# Patient Record
Sex: Female | Born: 1980 | Race: White | Hispanic: No | Marital: Married | Smoking: Never smoker
Health system: Southern US, Community
[De-identification: ages and names within clinical notes are randomized; demographics above are authoritative.]

## PROBLEM LIST (undated history)

## (undated) DIAGNOSIS — E785 Hyperlipidemia, unspecified: Secondary | ICD-10-CM

## (undated) DIAGNOSIS — T7840XA Allergy, unspecified, initial encounter: Secondary | ICD-10-CM

## (undated) DIAGNOSIS — I10 Essential (primary) hypertension: Secondary | ICD-10-CM

## (undated) HISTORY — DX: Essential (primary) hypertension: I10

## (undated) HISTORY — DX: Allergy, unspecified, initial encounter: T78.40XA

## (undated) HISTORY — DX: Hyperlipidemia, unspecified: E78.5

---

## 2014-10-09 LAB — LIPID PANEL
CHOLESTEROL: 291 mg/dL — AB (ref 0–200)
HDL: 61 mg/dL (ref 35–70)
LDL Cholesterol: 188 mg/dL
TRIGLYCERIDES: 210 mg/dL — AB (ref 40–160)

## 2014-10-09 LAB — TSH: TSH: 4.5 u[IU]/mL (ref <=5.90)

## 2014-10-09 LAB — HM PAP SMEAR: HM PAP: NEGATIVE

## 2014-10-09 LAB — BASIC METABOLIC PANEL
BUN: 13 mg/dL (ref 4–21)
CREATININE: 0.8 mg/dL (ref <=1.1)

## 2014-10-09 LAB — HEPATIC FUNCTION PANEL
ALT: 100 U/L — AB (ref 7–35)
AST: 73 U/L — AB (ref 13–35)

## 2014-10-09 LAB — CBC AND DIFFERENTIAL: HEMOGLOBIN: 14.4 g/dL (ref 12.0–16.0)

## 2015-07-29 ENCOUNTER — Other Ambulatory Visit: Payer: Self-pay | Admitting: Internal Medicine

## 2015-08-03 ENCOUNTER — Other Ambulatory Visit: Payer: Self-pay | Admitting: Internal Medicine

## 2015-09-30 ENCOUNTER — Other Ambulatory Visit: Payer: Self-pay | Admitting: Internal Medicine

## 2015-09-30 ENCOUNTER — Encounter: Payer: Self-pay | Admitting: Internal Medicine

## 2015-09-30 DIAGNOSIS — I10 Essential (primary) hypertension: Secondary | ICD-10-CM | POA: Insufficient documentation

## 2015-09-30 DIAGNOSIS — Z3009 Encounter for other general counseling and advice on contraception: Secondary | ICD-10-CM | POA: Insufficient documentation

## 2015-09-30 DIAGNOSIS — E782 Mixed hyperlipidemia: Secondary | ICD-10-CM | POA: Insufficient documentation

## 2015-09-30 DIAGNOSIS — R7989 Other specified abnormal findings of blood chemistry: Secondary | ICD-10-CM | POA: Insufficient documentation

## 2015-09-30 DIAGNOSIS — R945 Abnormal results of liver function studies: Principal | ICD-10-CM

## 2015-09-30 DIAGNOSIS — D693 Immune thrombocytopenic purpura: Secondary | ICD-10-CM | POA: Insufficient documentation

## 2016-07-23 ENCOUNTER — Other Ambulatory Visit: Payer: Self-pay | Admitting: Internal Medicine

## 2016-07-24 NOTE — Telephone Encounter (Signed)
Patient is scheduled for 9/20 @ 230 for follow up

## 2016-07-28 ENCOUNTER — Other Ambulatory Visit: Payer: Self-pay | Admitting: Internal Medicine

## 2016-07-30 ENCOUNTER — Ambulatory Visit (INDEPENDENT_AMBULATORY_CARE_PROVIDER_SITE_OTHER): Payer: 59 | Admitting: Internal Medicine

## 2016-07-30 ENCOUNTER — Encounter: Payer: Self-pay | Admitting: Internal Medicine

## 2016-07-30 VITALS — BP 154/72 | HR 98 | Resp 16 | Ht 67.5 in | Wt 213.0 lb

## 2016-07-30 DIAGNOSIS — I10 Essential (primary) hypertension: Secondary | ICD-10-CM | POA: Diagnosis not present

## 2016-07-30 DIAGNOSIS — R945 Abnormal results of liver function studies: Secondary | ICD-10-CM

## 2016-07-30 DIAGNOSIS — A770 Spotted fever due to Rickettsia rickettsii: Secondary | ICD-10-CM

## 2016-07-30 DIAGNOSIS — E782 Mixed hyperlipidemia: Secondary | ICD-10-CM | POA: Diagnosis not present

## 2016-07-30 DIAGNOSIS — R7989 Other specified abnormal findings of blood chemistry: Secondary | ICD-10-CM

## 2016-07-30 NOTE — Progress Notes (Signed)
Date:  07/30/2016   Name:  Darlene Munoz   DOB:  1981-09-08   MRN:  [id number]   Chief Complaint: Hypertension Hypertension  This is a chronic problem. The current episode started more than 1 year ago. The problem is unchanged. The problem is controlled (high in doctors office but normal at home). Associated symptoms include headaches. Pertinent negatives include no chest pain, palpitations or shortness of breath.   RMSF - tick bite about 2 months ago then developed headache, low grade fever, rash and transient muscle cramps with facial tingling.  She started on empiric Doxy and felt better after a few days.  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  No bleeding issues.   Review of Systems  Constitutional: Positive for fatigue. Negative for chills, diaphoresis and fever.  Eyes: Negative for visual disturbance.  Respiratory: Negative for cough, chest tightness and shortness of breath.   Cardiovascular: Negative for chest pain, palpitations and leg swelling.  Gastrointestinal: Negative for abdominal pain.  Musculoskeletal: Negative for arthralgias, back pain and gait problem.  Skin: Positive for rash (resolving).  Neurological: Positive for headaches. Negative for dizziness.  Hematological: Negative for adenopathy. Does not bruise/bleed easily.  Psychiatric/Behavioral: Negative for sleep disturbance. The patient is not nervous/anxious.     Patient Active Problem List   Diagnosis Date Noted  . Benign hypertension 09/30/2015  . 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 Family planning 09/30/2015  . Elevated LFTs 09/30/2015  . Hyperlipidemia, mixed 09/30/2015    Prior to Admission medications   Medication Sig Start Date End Date Taking? Authorizing Provider  doxycycline (VIBRAMYCIN) 100 MG capsule Take 100 mg by mouth 2 (two) times daily.   Yes Historical Provider, MD  hydrochlorothiazide (HYDRODIURIL) 25 MG tablet Take 1 tablet by mouth  daily 07/28/16  Yes Glean Hess, MD    JUNEL 1.5/30 1.5-30 MG-MCG tablet Take 1 tablet by mouth  daily 07/23/16  Yes Glean Hess, MD  lisinopril (PRINIVIL,ZESTRIL) 10 MG tablet Take 1 tablet by mouth  daily 07/28/16  Yes Glean Hess, MD  metoprolol succinate (TOPROL-XL) 25 MG 24 hr tablet Take 1 tablet by mouth  daily 07/28/16  Yes Glean Hess, MD    No Known Allergies  History reviewed. No pertinent surgical history.  Social History  Substance Use Topics  . Smoking status: Never Smoker  . Smokeless tobacco: Never Used  . Alcohol use No     Medication list has been reviewed and updated.   Physical Exam  Constitutional: She is oriented to person, place, and time. She appears well-developed. No distress.  HENT:  Head: Normocephalic and atraumatic.  Neck: Normal range of motion. Neck supple. Carotid bruit is not present. No thyromegaly present.  Cardiovascular: Regular rhythm, normal heart sounds and intact distal pulses.  Tachycardia present.   Pulmonary/Chest: Effort normal and breath sounds normal. No respiratory distress. She has no decreased breath sounds. She has no wheezes.  Musculoskeletal: Normal range of motion. She exhibits no edema or tenderness.  Lymphadenopathy:    She has no cervical adenopathy.  Neurological: She is alert and oriented to person, place, and time.  Skin: Skin is warm and dry. Rash noted.     Psychiatric: She has a normal mood and affect. Her behavior is normal. Thought content normal.  Nursing note and vitals reviewed.   BP (!) 148/88   Pulse (!) 125   Resp 16   Ht 5' 7.5" (1.715 m)   Wt 213  lb (96.6 kg)   LMP 07/16/2016   SpO2 99%   BMI 32.87 kg/m   Assessment and Plan: 1. Benign hypertension Well controlled at home with [facility] established - CBC with Differential/Platelet - TSH  2. Elevated LFTs Continue to monitor - Comprehensive metabolic panel  3. Hyperlipidemia, mixed Return for CPX and fasting labs 4-6 months  4. RMSF [facility] spotted  fever) Finish course of doxycycline  Halina Maidens, MD [facility]  07/30/2016

## 2016-07-31 ENCOUNTER — Encounter: Payer: Self-pay | Admitting: Internal Medicine

## 2016-07-31 LAB — TSH: TSH: 3.88 u[IU]/mL (ref 0.450–4.500)

## 2016-07-31 LAB — CBC WITH DIFFERENTIAL/PLATELET
Basophils Absolute: 0 10*3/uL (ref 0.0–0.2)
Basos: 0 %
EOS (ABSOLUTE): 0.1 10*3/uL (ref 0.0–0.4)
EOS: 1 %
HEMATOCRIT: 39.3 % (ref 34.0–46.6)
HEMOGLOBIN: 13.6 g/dL (ref 11.1–15.9)
Immature Grans (Abs): 0 10*3/uL (ref 0.0–0.1)
Immature Granulocytes: 0 %
LYMPHS ABS: 2.1 10*3/uL (ref 0.7–3.1)
Lymphs: 29 %
MCH: 30 pg (ref 26.6–33.0)
MCHC: 34.6 g/dL (ref 31.5–35.7)
MCV: 87 fL (ref 79–97)
MONOCYTES: 7 %
Monocytes Absolute: 0.5 10*3/uL (ref 0.1–0.9)
NEUTROS ABS: 4.5 10*3/uL (ref 1.4–7.0)
Neutrophils: 63 %
Platelets: 199 10*3/uL (ref 150–379)
RBC: 4.54 x10E6/uL (ref 3.77–5.28)
RDW: 12.3 % (ref 12.3–15.4)
WBC: 7.1 10*3/uL (ref 3.4–10.8)

## 2016-07-31 LAB — COMPREHENSIVE METABOLIC PANEL
ALBUMIN: 4.4 g/dL (ref 3.5–5.5)
ALK PHOS: 74 IU/L (ref 39–117)
ALT: 45 IU/L — ABNORMAL HIGH (ref 0–32)
AST: 39 IU/L (ref 0–40)
Albumin/Globulin Ratio: 1.8 (ref 1.2–2.2)
BILIRUBIN TOTAL: 0.3 mg/dL (ref 0.0–1.2)
BUN / CREAT RATIO: 15 (ref 9–23)
BUN: 14 mg/dL (ref 6–20)
CHLORIDE: 97 mmol/L (ref 96–106)
CO2: 24 mmol/L (ref 18–29)
Calcium: 9.8 mg/dL (ref 8.7–10.2)
Creatinine, Ser: 0.91 mg/dL (ref 0.57–1.00)
GFR calc Af Amer: 97 mL/min/{1.73_m2} (ref >59)
GFR calc non Af Amer: 84 mL/min/{1.73_m2} (ref >59)
GLOBULIN, TOTAL: 2.5 g/dL (ref 1.5–4.5)
Glucose: 115 mg/dL — ABNORMAL HIGH (ref 65–99)
Potassium: 3.6 mmol/L (ref 3.5–5.2)
Sodium: 140 mmol/L (ref 134–144)
Total Protein: 6.9 g/dL (ref 6.0–8.5)

## 2016-08-25 ENCOUNTER — Encounter: Payer: Self-pay | Admitting: Internal Medicine

## 2016-08-27 DIAGNOSIS — H5213 Myopia, bilateral: Secondary | ICD-10-CM | POA: Diagnosis not present

## 2016-08-28 ENCOUNTER — Other Ambulatory Visit: Payer: Self-pay | Admitting: Internal Medicine

## 2016-08-28 MED ORDER — NORETHINDRONE ACET-ETHINYL EST 1.5-30 MG-MCG PO TABS: 1.0000 | ORAL_TABLET | Freq: Every day | ORAL | 3 refills | Status: DC

## 2016-11-21 ENCOUNTER — Other Ambulatory Visit: Payer: Self-pay | Admitting: Family Medicine

## 2016-11-21 DIAGNOSIS — J069 Acute upper respiratory infection, unspecified: Secondary | ICD-10-CM

## 2016-11-21 LAB — VERITOR FLU A/B WAIVED
INFLUENZA B: NEGATIVE
Influenza A: NEGATIVE

## 2016-11-24 NOTE — Progress Notes (Signed)
This encounter was created in error - please disregard.

## 2016-12-16 ENCOUNTER — Encounter: Payer: Self-pay | Admitting: Internal Medicine

## 2016-12-16 ENCOUNTER — Other Ambulatory Visit: Payer: Self-pay | Admitting: Internal Medicine

## 2016-12-16 MED ORDER — HYDROCHLOROTHIAZIDE 25 MG PO TABS: 25.0000 mg | ORAL_TABLET | Freq: Every day | ORAL | 3 refills | Status: DC

## 2016-12-16 MED ORDER — METOPROLOL SUCCINATE ER 25 MG PO TB24: 25.0000 mg | ORAL_TABLET | Freq: Every day | ORAL | 3 refills | Status: DC

## 2016-12-16 MED ORDER — LISINOPRIL 10 MG PO TABS: 10.0000 mg | ORAL_TABLET | Freq: Every day | ORAL | 3 refills | Status: DC

## 2016-12-16 MED ORDER — NORETHINDRONE ACET-ETHINYL EST 1.5-30 MG-MCG PO TABS: 1.0000 | ORAL_TABLET | Freq: Every day | ORAL | 3 refills | Status: DC

## 2016-12-24 ENCOUNTER — Encounter: Payer: Self-pay | Admitting: Internal Medicine

## 2016-12-25 ENCOUNTER — Other Ambulatory Visit: Payer: Self-pay | Admitting: Internal Medicine

## 2016-12-25 MED ORDER — METOPROLOL SUCCINATE ER 25 MG PO TB24: 25.0000 mg | ORAL_TABLET | Freq: Every day | ORAL | 0 refills | Status: DC

## 2016-12-25 MED ORDER — LISINOPRIL 10 MG PO TABS: 10.0000 mg | ORAL_TABLET | Freq: Every day | ORAL | 0 refills | Status: DC

## 2017-01-27 ENCOUNTER — Ambulatory Visit (INDEPENDENT_AMBULATORY_CARE_PROVIDER_SITE_OTHER): Payer: 59 | Admitting: Internal Medicine

## 2017-01-27 ENCOUNTER — Telehealth: Payer: Self-pay

## 2017-01-27 ENCOUNTER — Encounter: Payer: Self-pay | Admitting: Internal Medicine

## 2017-01-27 VITALS — BP 138/78 | HR 116 | Ht 67.5 in | Wt 213.0 lb

## 2017-01-27 DIAGNOSIS — Z0000 Encounter for general adult medical examination without abnormal findings: Secondary | ICD-10-CM | POA: Diagnosis not present

## 2017-01-27 DIAGNOSIS — D693 Immune thrombocytopenic purpura: Secondary | ICD-10-CM | POA: Diagnosis not present

## 2017-01-27 DIAGNOSIS — R7989 Other specified abnormal findings of blood chemistry: Secondary | ICD-10-CM | POA: Diagnosis not present

## 2017-01-27 DIAGNOSIS — I10 Essential (primary) hypertension: Secondary | ICD-10-CM | POA: Diagnosis not present

## 2017-01-27 DIAGNOSIS — R945 Abnormal results of liver function studies: Secondary | ICD-10-CM

## 2017-01-27 LAB — POCT URINALYSIS DIPSTICK
Bilirubin, UA: NEGATIVE
Glucose, UA: NEGATIVE
Ketones, UA: NEGATIVE
Leukocytes, UA: NEGATIVE
NITRITE UA: NEGATIVE
PH UA: 6 (ref 5.0–8.0)
Protein, UA: NEGATIVE
Spec Grav, UA: 1.01 (ref 1.030–1.035)
UROBILINOGEN UA: 0.2 (ref ?–2.0)

## 2017-01-27 MED ORDER — LISINOPRIL 20 MG PO TABS: 20.0000 mg | ORAL_TABLET | Freq: Every day | ORAL | 3 refills | Status: DC

## 2017-01-27 NOTE — Telephone Encounter (Signed)
ERROR

## 2017-01-27 NOTE — Progress Notes (Signed)
Date:  01/27/2017   Name:  Darlene Munoz   DOB:  1981/01/31   MRN:  [id number]   Chief Complaint: Annual Exam (no pap.) Darlene Munoz is a 36 y.o. female who presents today for her Complete Annual Exam. She feels well. She reports exercising regularly. She reports she is sleeping well except for night terrors that occur about twice a week. Menses are regular on OCPs.  No breast issues.  Pap is due next year.  Hypertension  This is a chronic problem. The current episode started more than 1 month ago. The problem is controlled. Pertinent negatives include no chest pain, headaches, palpitations or shortness of breath. Past treatments include ACE inhibitors, diuretics and beta blockers. The current treatment provides significant improvement.  BP are running in the 130's over 80's.  She would like it to be slightly lower.    Review of Systems  Constitutional: Negative for chills, fatigue and fever.  HENT: Negative for congestion, hearing loss, tinnitus, trouble swallowing and voice change.   Eyes: Negative for visual disturbance.  Respiratory: Negative for cough, chest tightness, shortness of breath and wheezing.   Cardiovascular: Negative for chest pain, palpitations and leg swelling.  Gastrointestinal: Negative for abdominal pain, constipation and diarrhea.  Endocrine: Negative for polydipsia and polyuria.  Genitourinary: Negative for dysuria, frequency, genital sores, vaginal bleeding and vaginal discharge.  Musculoskeletal: Negative for arthralgias, gait problem and joint swelling.  Skin: Negative for color change and rash.  Neurological: Negative for dizziness, tremors, light-headedness and headaches.  Hematological: Negative for adenopathy. Does not bruise/bleed easily.  Psychiatric/Behavioral: Positive for sleep disturbance. Negative for dysphoric mood. The patient is not nervous/anxious.     Patient Active Problem List   Diagnosis Date Noted  . Benign hypertension 09/30/2015   . Immune thrombocytopenia ([place]) 09/30/2015  . Family planning 09/30/2015  . Elevated LFTs 09/30/2015  . Hyperlipidemia, mixed 09/30/2015    Prior to Admission medications   Medication Sig Start Date End Date Taking? Authorizing Provider  hydrochlorothiazide (HYDRODIURIL) 25 MG tablet Take 1 tablet (25 mg total) by mouth daily. 12/16/16  Yes Glean Hess, MD  lisinopril (PRINIVIL,ZESTRIL) 10 MG tablet Take 1 tablet (10 mg total) by mouth daily. 12/25/16  Yes Glean Hess, MD  metoprolol succinate (TOPROL-XL) 25 MG 24 hr tablet Take 1 tablet (25 mg total) by mouth daily. 12/25/16  Yes Glean Hess, MD  Norethindrone Acetate-Ethinyl Estradiol (JUNEL 1.5/30) 1.5-30 MG-MCG tablet Take 1 tablet by mouth daily. 12/16/16  Yes Glean Hess, MD    No Known Allergies  History reviewed. No pertinent surgical history.  Social History  Substance Use Topics  . Smoking status: Never Smoker  . Smokeless tobacco: Never Used  . Alcohol use No     Medication list has been reviewed and updated.   Physical Exam  Constitutional: She is oriented to person, place, and time. She appears well-developed and well-nourished. No distress.  HENT:  Head: Normocephalic and atraumatic.  Right Ear: Tympanic membrane and ear canal normal.  Left Ear: Tympanic membrane and ear canal normal.  Nose: Right sinus exhibits no maxillary sinus tenderness. Left sinus exhibits no maxillary sinus tenderness.  Mouth/Throat: Uvula is midline and oropharynx is clear and moist.  Eyes: Conjunctivae and EOM are normal. Right eye exhibits no discharge. Left eye exhibits no discharge. No scleral icterus.  Neck: Normal range of motion. Carotid bruit is not present. No erythema present. No thyromegaly present.  Cardiovascular: Normal rate, regular rhythm,  normal heart sounds and normal pulses.   Pulmonary/Chest: Effort normal. No respiratory distress. She has no wheezes. Right breast exhibits no mass, no nipple discharge,  no skin change and no tenderness. Left breast exhibits no mass, no nipple discharge, no skin change and no tenderness.  Abdominal: Soft. Bowel sounds are normal. There is no hepatosplenomegaly. There is no tenderness. There is no CVA tenderness.  Musculoskeletal: Normal range of motion.  Lymphadenopathy:    She has no cervical adenopathy.    She has no axillary adenopathy.  Neurological: She is alert and oriented to person, place, and time. She has normal reflexes. No cranial nerve deficit or sensory deficit.  Skin: Skin is warm, dry and intact. No rash noted.  Psychiatric: She has a normal mood and affect. Her speech is normal and behavior is normal. Thought content normal.  Nursing note and vitals reviewed.   BP 138/78 (BP Location: Right Arm, Patient Position: Sitting, Cuff Size: Large)   Pulse (!) 116   Ht 5' 7.5" (1.715 m)   Wt 213 lb (96.6 kg)   LMP 01/27/2017 (Exact Date)   SpO2 100%   BMI 32.87 kg/m   Assessment and Plan: 1. Annual physical exam Normal exam Pap next year - Lipid panel - POCT urinalysis dipstick - Hemoglobin A1c  2. Benign hypertension Increase dose of lisinopril to 20 mg - Comprehensive metabolic panel - TSH  3. Immune thrombocytopenia (HCC) - CBC with Differential/Platelet  4. Elevated LFTs Monitoring - continue low fat diet   Meds ordered this encounter  Medications  . lisinopril (PRINIVIL,ZESTRIL) 20 MG tablet    Sig: Take 1 tablet (20 mg total) by mouth daily.    Dispense:  90 tablet    Refill:  [place], MD [facility]  01/27/2017

## 2017-01-27 NOTE — Patient Instructions (Signed)
DASH Eating Plan DASH stands for "Dietary Approaches to Stop Hypertension." The DASH eating plan is a healthy eating plan that has been shown to reduce high blood pressure (hypertension). It may also reduce your risk for type 2 diabetes, heart disease, and stroke. The DASH eating plan may also help with weight loss. What are tips for following this plan? General guidelines  Avoid eating more than 2,300 mg (milligrams) of salt (sodium) a day. If you have hypertension, you may need to reduce your sodium intake to 1,500 mg a day.  Limit alcohol intake to no more than 1 drink a day for nonpregnant women and 2 drinks a day for men. One drink equals 12 oz of beer, 5 oz of wine, or 1 oz of hard liquor.  Work with your health care provider to maintain a healthy body weight or to lose weight. Ask what an ideal weight is for you.  Get at least 30 minutes of exercise that causes your heart to beat faster (aerobic exercise) most days of the week. Activities may include walking, swimming, or biking.  Work with your health care provider or diet and nutrition specialist (dietitian) to adjust your eating plan to your individual calorie needs. Reading food labels  Check food labels for the amount of sodium per serving. Choose foods with less than 5 percent of the Daily Value of sodium. Generally, foods with less than 300 mg of sodium per serving fit into this eating plan.  To find whole grains, look for the word "whole" as the first word in the ingredient list. Shopping  Buy products labeled as "low-sodium" or "no salt added."  Buy fresh foods. Avoid canned foods and premade or frozen meals. Cooking  Avoid adding salt when cooking. Use salt-free seasonings or herbs instead of table salt or sea salt. Check with your health care provider or pharmacist before using salt substitutes.  Do not fry foods. Cook foods using healthy methods such as baking, boiling, grilling, and broiling instead.  Cook with  heart-healthy oils, such as olive, canola, soybean, or sunflower oil. Meal planning   Eat a balanced diet that includes: ? 5 or more servings of fruits and vegetables each day. At each meal, try to fill half of your plate with fruits and vegetables. ? Up to 6-8 servings of whole grains each day. ? Less than 6 oz of lean meat, poultry, or fish each day. A 3-oz serving of meat is about the same size as a deck of cards. One egg equals 1 oz. ? 2 servings of low-fat dairy each day. ? A serving of nuts, seeds, or beans 5 times each week. ? Heart-healthy fats. Healthy fats called Omega-3 fatty acids are found in foods such as flaxseeds and coldwater fish, like sardines, salmon, and mackerel.  Limit how much you eat of the following: ? Canned or prepackaged foods. ? Food that is high in trans fat, such as fried foods. ? Food that is high in saturated fat, such as fatty meat. ? Sweets, desserts, sugary drinks, and other foods with added sugar. ? Full-fat dairy products.  Do not salt foods before eating.  Try to eat at least 2 vegetarian meals each week.  Eat more home-cooked food and less restaurant, buffet, and fast food.  When eating at a restaurant, ask that your food be prepared with less salt or no salt, if possible. What foods are recommended? The items listed may not be a complete list. Talk with your dietitian about what   dietary choices are best for you. Grains Whole-grain or whole-wheat bread. Whole-grain or whole-wheat pasta. Brown rice. Oatmeal. Quinoa. Bulgur. Whole-grain and low-sodium cereals. Pita bread. Low-fat, low-sodium crackers. Whole-wheat flour tortillas. Vegetables Fresh or frozen vegetables (raw, steamed, roasted, or grilled). Low-sodium or reduced-sodium tomato and vegetable juice. Low-sodium or reduced-sodium tomato sauce and tomato paste. Low-sodium or reduced-sodium canned vegetables. Fruits All fresh, dried, or frozen fruit. Canned fruit in natural juice (without  added sugar). Meat and other protein foods Skinless chicken or turkey. Ground chicken or turkey. Pork with fat trimmed off. Fish and seafood. Egg whites. Dried beans, peas, or lentils. Unsalted nuts, nut butters, and seeds. Unsalted canned beans. Lean cuts of beef with fat trimmed off. Low-sodium, lean deli meat. Dairy Low-fat (1%) or fat-free (skim) milk. Fat-free, low-fat, or reduced-fat cheeses. Nonfat, low-sodium ricotta or cottage cheese. Low-fat or nonfat yogurt. Low-fat, low-sodium cheese. Fats and oils Soft margarine without trans fats. Vegetable oil. Low-fat, reduced-fat, or light mayonnaise and salad dressings (reduced-sodium). Canola, safflower, olive, soybean, and sunflower oils. Avocado. Seasoning and other foods Herbs. Spices. Seasoning mixes without salt. Unsalted popcorn and pretzels. Fat-free sweets. What foods are not recommended? The items listed may not be a complete list. Talk with your dietitian about what dietary choices are best for you. Grains Baked goods made with fat, such as croissants, muffins, or some breads. Dry pasta or rice meal packs. Vegetables Creamed or fried vegetables. Vegetables in a cheese sauce. Regular canned vegetables (not low-sodium or reduced-sodium). Regular canned tomato sauce and paste (not low-sodium or reduced-sodium). Regular tomato and vegetable juice (not low-sodium or reduced-sodium). Pickles. Olives. Fruits Canned fruit in a light or heavy syrup. Fried fruit. Fruit in cream or butter sauce. Meat and other protein foods Fatty cuts of meat. Ribs. Fried meat. Bacon. Sausage. Bologna and other processed lunch meats. Salami. Fatback. Hotdogs. Bratwurst. Salted nuts and seeds. Canned beans with added salt. Canned or smoked fish. Whole eggs or egg yolks. Chicken or turkey with skin. Dairy Whole or 2% milk, cream, and half-and-half. Whole or full-fat cream cheese. Whole-fat or sweetened yogurt. Full-fat cheese. Nondairy creamers. Whipped toppings.  Processed cheese and cheese spreads. Fats and oils Butter. Stick margarine. Lard. Shortening. Ghee. Bacon fat. Tropical oils, such as coconut, palm kernel, or palm oil. Seasoning and other foods Salted popcorn and pretzels. Onion salt, garlic salt, seasoned salt, table salt, and sea salt. Worcestershire sauce. Tartar sauce. Barbecue sauce. Teriyaki sauce. Soy sauce, including reduced-sodium. Steak sauce. Canned and packaged gravies. Fish sauce. Oyster sauce. Cocktail sauce. Horseradish that you find on the shelf. Ketchup. Mustard. Meat flavorings and tenderizers. Bouillon cubes. Hot sauce and Tabasco sauce. Premade or packaged marinades. Premade or packaged taco seasonings. Relishes. Regular salad dressings. Where to find more information:  National Heart, Lung, and Blood Institute: www.nhlbi.nih.gov  American Heart Association: www.heart.org Summary  The DASH eating plan is a healthy eating plan that has been shown to reduce high blood pressure (hypertension). It may also reduce your risk for type 2 diabetes, heart disease, and stroke.  With the DASH eating plan, you should limit salt (sodium) intake to 2,300 mg a day. If you have hypertension, you may need to reduce your sodium intake to 1,500 mg a day.  When on the DASH eating plan, aim to eat more fresh fruits and vegetables, whole grains, lean proteins, low-fat dairy, and heart-healthy fats.  Work with your health care provider or diet and nutrition specialist (dietitian) to adjust your eating plan to your individual   calorie needs. This information is not intended to replace advice given to you by your health care provider. Make sure you discuss any questions you have with your health care provider. Document Released: 10/16/2011 Document Revised: 10/20/2016 Document Reviewed: 10/20/2016 Elsevier Interactive Patient Education  2017 Elsevier Inc.  

## 2017-01-28 LAB — CBC WITH DIFFERENTIAL/PLATELET
BASOS ABS: 0 10*3/uL (ref 0.0–0.2)
Basos: 1 %
EOS (ABSOLUTE): 0.1 10*3/uL (ref 0.0–0.4)
Eos: 1 %
HEMOGLOBIN: 14.4 g/dL (ref 11.1–15.9)
Hematocrit: 43.1 % (ref 34.0–46.6)
Immature Grans (Abs): 0 10*3/uL (ref 0.0–0.1)
Immature Granulocytes: 0 %
Lymphocytes Absolute: 1.9 10*3/uL (ref 0.7–3.1)
Lymphs: 27 %
MCH: 30.2 pg (ref 26.6–33.0)
MCHC: 33.4 g/dL (ref 31.5–35.7)
MCV: 90 fL (ref 79–97)
MONOCYTES: 4 %
Monocytes Absolute: 0.3 10*3/uL (ref 0.1–0.9)
NEUTROS ABS: 4.5 10*3/uL (ref 1.4–7.0)
Neutrophils: 67 %
PLATELETS: 178 10*3/uL (ref 150–379)
RBC: 4.77 x10E6/uL (ref 3.77–5.28)
RDW: 13 % (ref 12.3–15.4)
WBC: 6.8 10*3/uL (ref 3.4–10.8)

## 2017-01-28 LAB — LIPID PANEL
CHOL/HDL RATIO: 4.8 ratio — AB (ref 0.0–4.4)
Cholesterol, Total: 246 mg/dL — ABNORMAL HIGH (ref 100–199)
HDL: 51 mg/dL (ref >39)
LDL CALC: 155 mg/dL — AB (ref 0–99)
TRIGLYCERIDES: 201 mg/dL — AB (ref 0–149)
VLDL Cholesterol Cal: 40 mg/dL (ref 5–40)

## 2017-01-28 LAB — COMPREHENSIVE METABOLIC PANEL
ALBUMIN: 4.7 g/dL (ref 3.5–5.5)
ALT: 65 IU/L — AB (ref 0–32)
AST: 41 IU/L — ABNORMAL HIGH (ref 0–40)
Albumin/Globulin Ratio: 1.9 (ref 1.2–2.2)
Alkaline Phosphatase: 83 IU/L (ref 39–117)
BUN / CREAT RATIO: 20 (ref 9–23)
BUN: 17 mg/dL (ref 6–20)
Bilirubin Total: 0.4 mg/dL (ref 0.0–1.2)
CALCIUM: 9.7 mg/dL (ref 8.7–10.2)
CO2: 24 mmol/L (ref 18–29)
CREATININE: 0.85 mg/dL (ref 0.57–1.00)
Chloride: 98 mmol/L (ref 96–106)
GFR calc non Af Amer: 90 mL/min/{1.73_m2} (ref >59)
GFR, EST AFRICAN AMERICAN: 104 mL/min/{1.73_m2} (ref >59)
GLUCOSE: 72 mg/dL (ref 65–99)
Globulin, Total: 2.5 g/dL (ref 1.5–4.5)
Potassium: 4.2 mmol/L (ref 3.5–5.2)
Sodium: 142 mmol/L (ref 134–144)
TOTAL PROTEIN: 7.2 g/dL (ref 6.0–8.5)

## 2017-01-28 LAB — TSH: TSH: 5.11 u[IU]/mL — ABNORMAL HIGH (ref 0.450–4.500)

## 2017-01-28 LAB — HEMOGLOBIN A1C
ESTIMATED AVERAGE GLUCOSE: 100 mg/dL
Hgb A1c MFr Bld: 5.1 % (ref 4.8–5.6)

## 2017-03-03 ENCOUNTER — Encounter: Payer: Self-pay | Admitting: Internal Medicine

## 2017-07-29 ENCOUNTER — Ambulatory Visit (INDEPENDENT_AMBULATORY_CARE_PROVIDER_SITE_OTHER): Payer: 59 | Admitting: Internal Medicine

## 2017-07-29 ENCOUNTER — Encounter: Payer: Self-pay | Admitting: Internal Medicine

## 2017-07-29 VITALS — BP 122/78 | HR 110 | Ht 67.5 in | Wt 212.0 lb

## 2017-07-29 DIAGNOSIS — I10 Essential (primary) hypertension: Secondary | ICD-10-CM | POA: Diagnosis not present

## 2017-07-29 DIAGNOSIS — R946 Abnormal results of thyroid function studies: Secondary | ICD-10-CM | POA: Diagnosis not present

## 2017-07-29 DIAGNOSIS — E782 Mixed hyperlipidemia: Secondary | ICD-10-CM | POA: Diagnosis not present

## 2017-07-29 DIAGNOSIS — G4750 Parasomnia, unspecified: Secondary | ICD-10-CM | POA: Diagnosis not present

## 2017-07-29 DIAGNOSIS — R7989 Other specified abnormal findings of blood chemistry: Secondary | ICD-10-CM

## 2017-07-29 DIAGNOSIS — D693 Immune thrombocytopenic purpura: Secondary | ICD-10-CM

## 2017-07-29 NOTE — Progress Notes (Signed)
Date:  07/29/2017   Name:  Darlene Munoz   DOB:  [birth year]   MRN:  [id number]   Chief Complaint: Hypertension and Hypothyroidism Hypertension  Pertinent negatives include no chest pain, headaches, palpitations or shortness of breath. Identifiable causes of hypertension include a thyroid problem.  Thyroid Problem  Presents for follow-up visit. Patient reports no fatigue, menstrual problem, palpitations or tremors. Her past medical history is significant for hyperlipidemia.  Hyperlipidemia  This is a chronic problem. Recent lipid tests were reviewed and are high. There are no known factors aggravating her hyperlipidemia. Pertinent negatives include no chest pain or shortness of breath. Current antihyperlipidemic treatment includes diet change.  Last visit TSH was slightly elevated. She decided to hold off on supplements and have it rechecked at today's visit. Overall she is doing well perhaps slightly more fatigued than usual. No change in weight or appetite or mood other than mild irritability at times. Parasomnias - childhood she's had parasomnias that mostly involve night terrors or grabbing or wild thrashing movements in bed. Rarely does she have sleepwalking. According to her husband, she exhibits these behaviors once per night 4-5 times per week. Thrombocytopenia - this has been chronic and mild. No bleeding difficulties noted per patient  Lab Results  Component Value Date   TSH 5.110 (H) 01/27/2017     Review of Systems  Constitutional: Negative for chills, fatigue, fever and unexpected weight change.  Eyes: Negative for visual disturbance.  Respiratory: Negative for chest tightness and shortness of breath.   Cardiovascular: Negative for chest pain, palpitations and leg swelling.  Gastrointestinal: Negative for abdominal pain.  Genitourinary: Negative for menstrual problem.  Musculoskeletal: Negative for arthralgias.  Neurological: Negative for dizziness, tremors, weakness  and headaches.  Psychiatric/Behavioral: Positive for sleep disturbance (parasomnias with grabbing and jerking). Negative for dysphoric mood.    Patient Active Problem List   Diagnosis Date Noted  . Benign hypertension 09/30/2015  . Immune thrombocytopenia ([place]) 09/30/2015  . Family planning 09/30/2015  . Elevated LFTs 09/30/2015  . Hyperlipidemia, mixed 09/30/2015    Prior to Admission medications   Medication Sig Start Date End Date Taking? Authorizing Provider  hydrochlorothiazide (HYDRODIURIL) 25 MG tablet Take 1 tablet (25 mg total) by mouth daily. 12/16/16   Glean Hess, MD  lisinopril (PRINIVIL,ZESTRIL) 20 MG tablet Take 1 tablet (20 mg total) by mouth daily. 01/27/17   Glean Hess, MD  metoprolol succinate (TOPROL-XL) 25 MG 24 hr tablet Take 1 tablet (25 mg total) by mouth daily. 12/25/16   Glean Hess, MD  Norethindrone Acetate-Ethinyl Estradiol (JUNEL 1.5/30) 1.5-30 MG-MCG tablet Take 1 tablet by mouth daily. 12/16/16   Glean Hess, MD    No Known Allergies  History reviewed. No pertinent surgical history.  Social History  Substance Use Topics  . Smoking status: Never Smoker  . Smokeless tobacco: Never Used  . Alcohol use No     Medication list has been reviewed and updated.  PHQ 2/9 Scores 07/29/2017  PHQ - 2 Score 0    Physical Exam  Constitutional: She is oriented to person, place, and time. She appears well-developed. No distress.  HENT:  Head: Normocephalic and atraumatic.  Neck: Normal range of motion. Neck supple. Carotid bruit is not present. No thyromegaly present.  Cardiovascular: Normal rate, regular rhythm and normal heart sounds.   Pulmonary/Chest: Effort normal and breath sounds normal. No respiratory distress. She has no wheezes.  Musculoskeletal: Normal range of motion.  Neurological: She is  alert and oriented to person, place, and time.  Skin: Skin is warm, dry and intact. No rash noted.  Psychiatric: She has a normal mood  and affect. Her speech is normal and behavior is normal. Thought content normal.  Nursing note and vitals reviewed.   BP 124/78   Pulse (!) 110   Ht 5' 7.5" (1.715 m)   Wt 212 lb (96.2 kg)   LMP 07/14/2017 (Exact Date)   SpO2 98%   BMI 32.71 kg/m   Assessment and Plan: 1. Benign hypertension controlled - Comprehensive metabolic panel  2. Immune thrombocytopenia (HCC) Continue to monitor - CBC with Differential/Platelet  3. Abnormal TSH Repeat labs today - Thyroid antibodies - TSH  4. 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   Partially dictated using Editor, commissioning. Any errors are unintentional.  Halina Maidens, MD [facility]  07/29/2017

## 2017-07-30 LAB — COMPREHENSIVE METABOLIC PANEL
ALT: 75 IU/L — AB (ref 0–32)
AST: 57 IU/L — ABNORMAL HIGH (ref 0–40)
Albumin/Globulin Ratio: 1.7 (ref 1.2–2.2)
Albumin: 4.7 g/dL (ref 3.5–5.5)
Alkaline Phosphatase: 89 IU/L (ref 39–117)
BILIRUBIN TOTAL: 0.4 mg/dL (ref 0.0–1.2)
BUN/Creatinine Ratio: 18 (ref 9–23)
BUN: 14 mg/dL (ref 6–20)
CALCIUM: 9.5 mg/dL (ref 8.7–10.2)
CHLORIDE: 98 mmol/L (ref 96–106)
CO2: 24 mmol/L (ref 20–29)
CREATININE: 0.8 mg/dL (ref 0.57–1.00)
GFR calc non Af Amer: 97 mL/min/{1.73_m2} (ref >59)
GFR, EST AFRICAN AMERICAN: 112 mL/min/{1.73_m2} (ref >59)
Globulin, Total: 2.7 g/dL (ref 1.5–4.5)
Glucose: 75 mg/dL (ref 65–99)
Potassium: 3.7 mmol/L (ref 3.5–5.2)
Sodium: 140 mmol/L (ref 134–144)
TOTAL PROTEIN: 7.4 g/dL (ref 6.0–8.5)

## 2017-07-30 LAB — CBC WITH DIFFERENTIAL/PLATELET
BASOS ABS: 0 10*3/uL (ref 0.0–0.2)
Basos: 0 %
EOS (ABSOLUTE): 0.1 10*3/uL (ref 0.0–0.4)
Eos: 1 %
HEMOGLOBIN: 14.2 g/dL (ref 11.1–15.9)
Hematocrit: 41.1 % (ref 34.0–46.6)
IMMATURE GRANS (ABS): 0 10*3/uL (ref 0.0–0.1)
Immature Granulocytes: 0 %
LYMPHS: 28 %
Lymphocytes Absolute: 2.6 10*3/uL (ref 0.7–3.1)
MCH: 30.6 pg (ref 26.6–33.0)
MCHC: 34.5 g/dL (ref 31.5–35.7)
MCV: 89 fL (ref 79–97)
MONOCYTES: 6 %
Monocytes Absolute: 0.6 10*3/uL (ref 0.1–0.9)
NEUTROS ABS: 6 10*3/uL (ref 1.4–7.0)
Neutrophils: 65 %
Platelets: 186 10*3/uL (ref 150–379)
RBC: 4.64 x10E6/uL (ref 3.77–5.28)
RDW: 12.6 % (ref 12.3–15.4)
WBC: 9.3 10*3/uL (ref 3.4–10.8)

## 2017-07-30 LAB — LIPID PANEL
CHOLESTEROL TOTAL: 209 mg/dL — AB (ref 100–199)
Chol/HDL Ratio: 4.2 ratio (ref 0.0–4.4)
HDL: 50 mg/dL (ref >39)
LDL CALC: 125 mg/dL — AB (ref 0–99)
Triglycerides: 170 mg/dL — ABNORMAL HIGH (ref 0–149)
VLDL Cholesterol Cal: 34 mg/dL (ref 5–40)

## 2017-07-30 LAB — THYROID ANTIBODIES
Thyroglobulin Antibody: 2.4 IU/mL — ABNORMAL HIGH (ref 0.0–0.9)
Thyroperoxidase Ab SerPl-aCnc: 11 IU/mL (ref 0–34)

## 2017-07-30 LAB — TSH: TSH: 4.47 u[IU]/mL (ref 0.450–4.500)

## 2017-07-31 ENCOUNTER — Encounter: Payer: Self-pay | Admitting: Internal Medicine

## 2017-08-20 ENCOUNTER — Encounter: Payer: Self-pay | Admitting: Internal Medicine

## 2017-09-02 DIAGNOSIS — H5213 Myopia, bilateral: Secondary | ICD-10-CM | POA: Diagnosis not present

## 2017-10-24 ENCOUNTER — Encounter: Payer: Self-pay | Admitting: Internal Medicine

## 2017-10-24 ENCOUNTER — Other Ambulatory Visit: Payer: Self-pay | Admitting: Internal Medicine

## 2017-10-26 ENCOUNTER — Encounter: Payer: Self-pay | Admitting: Internal Medicine

## 2017-10-26 ENCOUNTER — Other Ambulatory Visit: Payer: Self-pay

## 2017-10-26 MED ORDER — NORETHINDRONE ACET-ETHINYL EST 1.5-30 MG-MCG PO TABS: 1.0000 | ORAL_TABLET | Freq: Every day | ORAL | 3 refills | Status: DC

## 2017-10-26 MED ORDER — LISINOPRIL 20 MG PO TABS: 20.0000 mg | ORAL_TABLET | Freq: Every day | ORAL | 3 refills | Status: DC

## 2017-10-26 MED ORDER — HYDROCHLOROTHIAZIDE 25 MG PO TABS: 25.0000 mg | ORAL_TABLET | Freq: Every day | ORAL | 3 refills | Status: DC

## 2017-10-26 MED ORDER — METOPROLOL SUCCINATE ER 25 MG PO TB24: 25.0000 mg | ORAL_TABLET | Freq: Every day | ORAL | 0 refills | Status: DC

## 2017-10-26 NOTE — Progress Notes (Signed)
Patient called requested refills on medication. Sent all four meds to [place].

## 2017-11-12 ENCOUNTER — Other Ambulatory Visit: Payer: Self-pay

## 2017-11-12 ENCOUNTER — Encounter: Payer: Self-pay | Admitting: Internal Medicine

## 2017-11-12 MED ORDER — METOPROLOL SUCCINATE ER 25 MG PO TB24: 25.0000 mg | ORAL_TABLET | Freq: Every day | ORAL | 3 refills | Status: DC

## 2017-11-27 ENCOUNTER — Encounter: Payer: Self-pay | Admitting: Internal Medicine

## 2017-11-30 ENCOUNTER — Telehealth: Payer: Self-pay

## 2017-11-30 NOTE — Telephone Encounter (Signed)
Filled out patient form for [employer] about Hypertension. Attatched last labs, and last 3 office visits as requested from form along with medication list. Faxed the form and received approval fax confirmation from [place].

## 2018-02-18 ENCOUNTER — Ambulatory Visit (INDEPENDENT_AMBULATORY_CARE_PROVIDER_SITE_OTHER): Payer: 59 | Admitting: Internal Medicine

## 2018-02-18 ENCOUNTER — Encounter: Payer: Self-pay | Admitting: Internal Medicine

## 2018-02-18 VITALS — BP 138/78 | HR 92 | Ht 67.5 in | Wt 220.0 lb

## 2018-02-18 DIAGNOSIS — E782 Mixed hyperlipidemia: Secondary | ICD-10-CM

## 2018-02-18 DIAGNOSIS — Z124 Encounter for screening for malignant neoplasm of cervix: Secondary | ICD-10-CM | POA: Diagnosis not present

## 2018-02-18 DIAGNOSIS — Z0000 Encounter for general adult medical examination without abnormal findings: Secondary | ICD-10-CM

## 2018-02-18 DIAGNOSIS — Z23 Encounter for immunization: Secondary | ICD-10-CM | POA: Diagnosis not present

## 2018-02-18 DIAGNOSIS — D693 Immune thrombocytopenic purpura: Secondary | ICD-10-CM | POA: Diagnosis not present

## 2018-02-18 DIAGNOSIS — I10 Essential (primary) hypertension: Secondary | ICD-10-CM

## 2018-02-18 LAB — POCT URINALYSIS DIPSTICK
BILIRUBIN UA: NEGATIVE
Blood, UA: NEGATIVE
GLUCOSE UA: NEGATIVE
Ketones, UA: NEGATIVE
Leukocytes, UA: NEGATIVE
Nitrite, UA: NEGATIVE
Protein, UA: NEGATIVE
Spec Grav, UA: <=1.005 — AB (ref 1.010–1.025)
Urobilinogen, UA: 0.2 E.U./dL
pH, UA: 7 (ref 5.0–8.0)

## 2018-02-18 NOTE — Progress Notes (Signed)
Date:  02/18/2018   Name:  Darlene Munoz   DOB:  [birth year]   MRN:  [id number]   Chief Complaint: Annual Exam (Breast Exam. Pap smear. 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 Needs tdap injection.)  Darlene Munoz is a 37 y.o. female who presents today for her Complete Annual Exam. She feels well. She reports exercising some. She reports she is sleeping well.   Hypertension  This is a chronic problem. The problem is controlled. Pertinent negatives include no chest pain, headaches, palpitations or shortness of breath. Past treatments include beta blockers, diuretics and ACE inhibitors. There are no compliance problems.      Review of Systems  Constitutional: Negative for chills, fatigue and fever.  HENT: Negative for congestion, hearing loss, tinnitus, trouble swallowing and voice change.   Eyes: Negative for visual disturbance.  Respiratory: Negative for cough, chest tightness, shortness of breath and wheezing.   Cardiovascular: Negative for chest pain, palpitations and leg swelling.  Gastrointestinal: Negative for abdominal pain, constipation, diarrhea and vomiting.  Endocrine: Negative for polydipsia and polyuria.  Genitourinary: Negative for dysuria, frequency, genital sores, vaginal bleeding and vaginal discharge.  Musculoskeletal: Negative for arthralgias, gait problem and joint swelling.  Skin: Negative for color change and rash.  Neurological: Negative for dizziness, tremors, light-headedness and headaches.  Hematological: Negative for adenopathy. Does not bruise/bleed easily.  Psychiatric/Behavioral: Negative for dysphoric mood and sleep disturbance. The patient is not nervous/anxious.     Patient Active Problem List   Diagnosis Date Noted  . 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 Benign hypertension 09/30/2015  . 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 Family planning 09/30/2015  . Elevated LFTs 09/30/2015  . Hyperlipidemia, mixed 09/30/2015    Prior to Admission medications     Medication Sig Start Date End Date Taking? Authorizing Provider  cetirizine (ZYRTEC) 10 MG tablet Take 10 mg by mouth daily.   Yes [provider]  hydrochlorothiazide (HYDRODIURIL) 25 MG tablet Take 1 tablet (25 mg total) by mouth daily. 10/26/17  Yes Glean Hess, MD  lisinopril (PRINIVIL,ZESTRIL) 20 MG tablet Take 1 tablet (20 mg total) by mouth daily. 10/26/17  Yes Glean Hess, MD  metoprolol succinate (TOPROL-XL) 25 MG 24 hr tablet Take 1 tablet (25 mg total) by mouth daily. 11/12/17  Yes Glean Hess, MD  Norethindrone Acetate-Ethinyl Estradiol (JUNEL 1.5/30) 1.5-30 MG-MCG tablet Take 1 tablet by mouth daily. 10/26/17  Yes Glean Hess, MD    No Known Allergies  History reviewed. No pertinent surgical history.  Social History   Tobacco Use  . Smoking status: Never Smoker  . Smokeless tobacco: Never Used  Substance Use Topics  . Alcohol use: No    Alcohol/week: 0.0 oz  . Drug use: Not on file     Medication list has been reviewed and updated.  PHQ 2/9 Scores 07/29/2017  PHQ - 2 Score 0    Physical Exam  Constitutional: She is oriented to person, place, and time. She appears well-developed and well-nourished. No distress.  HENT:  Head: Normocephalic and atraumatic.  Right Ear: Tympanic membrane and ear canal normal.  Left Ear: Tympanic membrane and ear canal normal.  Nose: Right sinus exhibits no maxillary sinus tenderness. Left sinus exhibits no maxillary sinus tenderness.  Mouth/Throat: Uvula is midline and oropharynx is clear and moist.  Eyes: Conjunctivae and EOM are normal. Right eye exhibits no discharge. Left eye exhibits no discharge. No scleral icterus.  Neck: Normal range of motion. Carotid bruit is not  present. No erythema present. No thyromegaly present.  Cardiovascular: Normal rate, regular rhythm, normal heart sounds and normal pulses.  Pulmonary/Chest: Effort normal. No respiratory distress. She has no wheezes. Right breast  exhibits no mass, no nipple discharge, no skin change and no tenderness. Left breast exhibits no mass, no nipple discharge, no skin change and no tenderness.  Abdominal: Soft. Bowel sounds are normal. There is no hepatosplenomegaly. There is no tenderness. There is no CVA tenderness.  Genitourinary: Vagina normal and uterus normal. There is no tenderness, lesion or injury on the right labia. There is no tenderness, lesion or injury on the left labia. Cervix exhibits friability. Cervix exhibits no motion tenderness and no discharge. Right adnexum displays no mass, no tenderness and no fullness. Left adnexum displays no mass, no tenderness and no fullness.  Musculoskeletal: Normal range of motion.  Lymphadenopathy:    She has no cervical adenopathy.    She has no axillary adenopathy.  Neurological: She is alert and oriented to person, place, and time. She has normal reflexes. No cranial nerve deficit or sensory deficit.  Skin: Skin is warm, dry and intact. No rash noted.  Psychiatric: She has a normal mood and affect. Her speech is normal and behavior is normal. Thought content normal.  Nursing note and vitals reviewed.   BP 138/78   Pulse 92   Ht 5' 7.5" (1.715 m)   Wt 220 lb (99.8 kg)   LMP 01/21/2018 (Exact Date)   SpO2 100%   BMI 33.95 kg/m   Assessment and Plan: 1. Annual physical exam Work on diet and exercise - Hemoglobin A1c - POCT urinalysis dipstick - TSH  2. Benign hypertension controlled - Comprehensive metabolic panel  3. Encounter for Papanicolaou smear for cervical cancer screening - Pap IG and HPV (high risk) DNA detection  4. Immune thrombocytopenia ([facility]) - CBC with Differential/Platelet  5. Hyperlipidemia, mixed - Lipid panel  6. Need for diphtheria-tetanus-pertussis (Tdap) vaccine - Tdap vaccine greater than or equal to 7yo IM   No orders of the defined types were placed in this encounter.   Partially dictated using Editor, commissioning. Any errors are  unintentional.  Halina Maidens, MD [facility]  02/18/2018

## 2018-02-18 NOTE — Patient Instructions (Signed)
Tdap Vaccine (Tetanus, Diphtheria and Pertussis): What You Need to Know 1. Why get vaccinated? Tetanus, diphtheria and pertussis are very serious diseases. Tdap vaccine can protect us from these diseases. And, Tdap vaccine given to pregnant women can protect newborn babies against pertussis. TETANUS (Lockjaw) is rare in the [place] today. It causes painful muscle tightening and stiffness, usually all over the body.  It can lead to tightening of muscles in the head and neck so you can't open your mouth, swallow, or sometimes even breathe. Tetanus kills about 1 out of 10 people who are infected even after receiving the best medical care.  DIPHTHERIA is also rare in the [place] today. It can cause a thick coating to form in the back of the throat.  It can lead to breathing problems, heart failure, paralysis, and death.  PERTUSSIS (Whooping Cough) causes severe coughing spells, which can cause difficulty breathing, vomiting and disturbed sleep.  It can also lead to weight loss, incontinence, and rib fractures. Up to 2 in 100 adolescents and 5 in 100 adults with pertussis are hospitalized or have complications, which could include pneumonia or death.  These diseases are caused by bacteria. Diphtheria and pertussis are spread from person to person through secretions from coughing or sneezing. Tetanus enters the body through cuts, scratches, or wounds. Before vaccines, as many as 200,000 cases of diphtheria, 200,000 cases of pertussis, and hundreds of cases of tetanus, were reported in the [place] each year. Since vaccination began, reports of cases for tetanus and diphtheria have dropped by about 99% and for pertussis by about 80%. 2. Tdap vaccine Tdap vaccine can protect adolescents and adults from tetanus, diphtheria, and pertussis. One dose of Tdap is routinely given at age 11 or 12. People who did not get Tdap at that age should get it as soon as possible. Tdap is especially  important for healthcare professionals and anyone having close contact with a baby younger than 12 months. Pregnant women should get a dose of Tdap during every pregnancy, to protect the newborn from pertussis. Infants are most at risk for severe, life-threatening complications from pertussis. Another vaccine, called Td, protects against tetanus and diphtheria, but not pertussis. A Td booster should be given every 10 years. Tdap may be given as one of these boosters if you have never gotten Tdap before. Tdap may also be given after a severe cut or burn to prevent tetanus infection. Your doctor or the person giving you the vaccine can give you more information. Tdap may safely be given at the same time as other vaccines. 3. Some people should not get this vaccine  A person who has ever had a life-threatening allergic reaction after a previous dose of any diphtheria, tetanus or pertussis containing vaccine, OR has a severe allergy to any part of this vaccine, should not get Tdap vaccine. Tell the person giving the vaccine about any severe allergies.  Anyone who had coma or long repeated seizures within 7 days after a childhood dose of DTP or DTaP, or a previous dose of Tdap, should not get Tdap, unless a cause other than the vaccine was found. They can still get Td.  Talk to your doctor if you: ? have seizures or another nervous system problem, ? had severe pain or swelling after any vaccine containing diphtheria, tetanus or pertussis, ? ever had a condition called Guillain-Barr Syndrome (GBS), ? aren't feeling well on the day the shot is scheduled. 4. Risks With any medicine, including   vaccines, there is a chance of side effects. These are usually mild and go away on their own. Serious reactions are also possible but are rare. Most people who get Tdap vaccine do not have any problems with it. Mild problems following Tdap: (Did not interfere with activities)  Pain where the shot was given (about  3 in 4 adolescents or 2 in 3 adults)  Redness or swelling where the shot was given (about 1 person in 5)  Mild fever of at least 100.4F (up to about 1 in 25 adolescents or 1 in 100 adults)  Headache (about 3 or 4 people in 10)  Tiredness (about 1 person in 3 or 4)  Nausea, vomiting, diarrhea, stomach ache (up to 1 in 4 adolescents or 1 in 10 adults)  Chills, sore joints (about 1 person in 10)  Body aches (about 1 person in 3 or 4)  Rash, swollen glands (uncommon)  Moderate problems following Tdap: (Interfered with activities, but did not require medical attention)  Pain where the shot was given (up to 1 in 5 or 6)  Redness or swelling where the shot was given (up to about 1 in 16 adolescents or 1 in 12 adults)  Fever over 102F (about 1 in 100 adolescents or 1 in 250 adults)  Headache (about 1 in 7 adolescents or 1 in 10 adults)  Nausea, vomiting, diarrhea, stomach ache (up to 1 or 3 people in 100)  Swelling of the entire arm where the shot was given (up to about 1 in 500).  Severe problems following Tdap: (Unable to perform usual activities; required medical attention)  Swelling, severe pain, bleeding and redness in the arm where the shot was given (rare).  Problems that could happen after any vaccine:  People sometimes faint after a medical procedure, including vaccination. Sitting or lying down for about 15 minutes can help prevent fainting, and injuries caused by a fall. Tell your doctor if you feel dizzy, or have vision changes or ringing in the ears.  Some people get severe pain in the shoulder and have difficulty moving the arm where a shot was given. This happens very rarely.  Any medication can cause a severe allergic reaction. Such reactions from a vaccine are very rare, estimated at fewer than 1 in a million doses, and would happen within a few minutes to a few hours after the vaccination. As with any medicine, there is a very remote chance of a vaccine  causing a serious injury or death. The safety of vaccines is always being monitored. For more information, visit: www.cdc.gov/vaccinesafety/ 5. What if there is a serious problem? What should I look for? Look for anything that concerns you, such as signs of a severe allergic reaction, very high fever, or unusual behavior. Signs of a severe allergic reaction can include hives, swelling of the face and throat, difficulty breathing, a fast heartbeat, dizziness, and weakness. These would usually start a few minutes to a few hours after the vaccination. What should I do?  If you think it is a severe allergic reaction or other emergency that can't wait, call 9-1-1 or get the person to the nearest hospital. Otherwise, call your doctor.  Afterward, the reaction should be reported to the Vaccine Adverse Event Reporting System (VAERS). Your doctor might file this report, or you can do it yourself through the VAERS web site at www.vaers.hhs.gov, or by calling 1-800-822-7967. ? VAERS does not give medical advice. 6. The National Vaccine Injury Compensation Program The National   Vaccine Injury Compensation Program (VICP) is a federal program that was created to compensate people who may have been injured by certain vaccines. Persons who believe they may have been injured by a vaccine can learn about the program and about filing a claim by calling 1-800-338-2382 or visiting the VICP website at www.hrsa.gov/vaccinecompensation. There is a time limit to file a claim for compensation. 7. How can I learn more?  Ask your doctor. He or she can give you the vaccine package insert or suggest other sources of information.  Call your local or state health department.  Contact the Centers for Disease Control and Prevention (CDC): ? Call 1-800-232-4636 (1-800-CDC-INFO) or ? Visit CDC's website at www.cdc.gov/vaccines CDC Tdap Vaccine VIS (01/03/14) This information is not intended to replace advice given to you by your  health care provider. Make sure you discuss any questions you have with your health care provider. Document Released: 04/27/2012 Document Revised: 07/17/2016 Document Reviewed: 07/17/2016 Elsevier Interactive Patient Education  2017 Elsevier Inc.  

## 2018-02-19 LAB — COMPREHENSIVE METABOLIC PANEL
ALBUMIN: 4.5 g/dL (ref 3.5–5.5)
ALT: 44 IU/L — ABNORMAL HIGH (ref 0–32)
AST: 34 IU/L (ref 0–40)
Albumin/Globulin Ratio: 1.8 (ref 1.2–2.2)
Alkaline Phosphatase: 71 IU/L (ref 39–117)
BUN / CREAT RATIO: 15 (ref 9–23)
BUN: 14 mg/dL (ref 6–20)
Bilirubin Total: 0.3 mg/dL (ref 0.0–1.2)
CALCIUM: 9.8 mg/dL (ref 8.7–10.2)
CO2: 25 mmol/L (ref 20–29)
CREATININE: 0.93 mg/dL (ref 0.57–1.00)
Chloride: 94 mmol/L — ABNORMAL LOW (ref 96–106)
GFR calc Af Amer: 93 mL/min/{1.73_m2} (ref >59)
GFR calc non Af Amer: 80 mL/min/{1.73_m2} (ref >59)
GLOBULIN, TOTAL: 2.5 g/dL (ref 1.5–4.5)
Glucose: 67 mg/dL (ref 65–99)
Potassium: 4.2 mmol/L (ref 3.5–5.2)
SODIUM: 140 mmol/L (ref 134–144)
Total Protein: 7 g/dL (ref 6.0–8.5)

## 2018-02-19 LAB — CBC WITH DIFFERENTIAL/PLATELET
Basophils Absolute: 0 10*3/uL (ref 0.0–0.2)
Basos: 0 %
EOS (ABSOLUTE): 0 10*3/uL (ref 0.0–0.4)
EOS: 0 %
HEMATOCRIT: 40.5 % (ref 34.0–46.6)
HEMOGLOBIN: 13.9 g/dL (ref 11.1–15.9)
IMMATURE GRANULOCYTES: 0 %
Immature Grans (Abs): 0 10*3/uL (ref 0.0–0.1)
LYMPHS ABS: 2 10*3/uL (ref 0.7–3.1)
Lymphs: 26 %
MCH: 30 pg (ref 26.6–33.0)
MCHC: 34.3 g/dL (ref 31.5–35.7)
MCV: 88 fL (ref 79–97)
Monocytes Absolute: 0.5 10*3/uL (ref 0.1–0.9)
Monocytes: 7 %
Neutrophils Absolute: 5 10*3/uL (ref 1.4–7.0)
Neutrophils: 67 %
Platelets: 278 10*3/uL (ref 150–379)
RBC: 4.63 x10E6/uL (ref 3.77–5.28)
RDW: 13.3 % (ref 12.3–15.4)
WBC: 7.5 10*3/uL (ref 3.4–10.8)

## 2018-02-19 LAB — LIPID PANEL
CHOL/HDL RATIO: 4.2 ratio (ref 0.0–4.4)
Cholesterol, Total: 227 mg/dL — ABNORMAL HIGH (ref 100–199)
HDL: 54 mg/dL (ref >39)
LDL Calculated: 135 mg/dL — ABNORMAL HIGH (ref 0–99)
TRIGLYCERIDES: 189 mg/dL — AB (ref 0–149)
VLDL CHOLESTEROL CAL: 38 mg/dL (ref 5–40)

## 2018-02-19 LAB — HEMOGLOBIN A1C
Est. average glucose Bld gHb Est-mCnc: 108 mg/dL
Hgb A1c MFr Bld: 5.4 % (ref 4.8–5.6)

## 2018-02-19 LAB — TSH: TSH: 3.78 u[IU]/mL (ref 0.450–4.500)

## 2018-02-22 LAB — PAP IG AND HPV HIGH-RISK
HPV, HIGH-RISK: NEGATIVE
PAP SMEAR COMMENT: 0

## 2018-08-06 ENCOUNTER — Encounter: Payer: Self-pay | Admitting: Internal Medicine

## 2018-08-18 ENCOUNTER — Encounter: Payer: Self-pay | Admitting: Internal Medicine

## 2018-08-18 ENCOUNTER — Ambulatory Visit: Payer: 59 | Admitting: Internal Medicine

## 2018-08-18 ENCOUNTER — Other Ambulatory Visit
Admission: RE | Admit: 2018-08-18 | Discharge: 2018-08-18 | Disposition: A | Discharge status: Home / Self Care | Payer: 59 | Source: Ambulatory Visit | Attending: Internal Medicine | Admitting: Internal Medicine

## 2018-08-18 VITALS — BP 116/62 | HR 98 | Ht 67.5 in | Wt 212.0 lb

## 2018-08-18 DIAGNOSIS — I10 Essential (primary) hypertension: Secondary | ICD-10-CM | POA: Insufficient documentation

## 2018-08-18 DIAGNOSIS — E782 Mixed hyperlipidemia: Secondary | ICD-10-CM | POA: Diagnosis not present

## 2018-08-18 LAB — COMPREHENSIVE METABOLIC PANEL
ALBUMIN: 4.3 g/dL (ref 3.5–5.0)
ALT: 144 U/L — ABNORMAL HIGH (ref 0–44)
AST: 93 U/L — AB (ref 15–41)
Alkaline Phosphatase: 78 U/L (ref 38–126)
Anion gap: 13 (ref 5–15)
BILIRUBIN TOTAL: 0.8 mg/dL (ref 0.3–1.2)
BUN: 14 mg/dL (ref 6–20)
CO2: 27 mmol/L (ref 22–32)
Calcium: 9.7 mg/dL (ref 8.9–10.3)
Chloride: 98 mmol/L (ref 98–111)
Creatinine, Ser: 0.99 mg/dL (ref 0.44–1.00)
GFR calc Af Amer: >60 mL/min (ref >60)
GFR calc non Af Amer: >60 mL/min (ref >60)
GLUCOSE: 89 mg/dL (ref 70–99)
POTASSIUM: 3.6 mmol/L (ref 3.5–5.1)
Sodium: 138 mmol/L (ref 135–145)
TOTAL PROTEIN: 8.4 g/dL — AB (ref 6.5–8.1)

## 2018-08-18 LAB — LIPID PANEL
CHOLESTEROL: 264 mg/dL — AB (ref 0–200)
HDL: 48 mg/dL (ref >40)
LDL Cholesterol: 177 mg/dL — ABNORMAL HIGH (ref 0–99)
TRIGLYCERIDES: 196 mg/dL — AB (ref <150)
Total CHOL/HDL Ratio: 5.5 RATIO
VLDL: 39 mg/dL (ref 0–40)

## 2018-08-18 LAB — TSH: TSH: 2.049 u[IU]/mL (ref 0.350–4.500)

## 2018-08-18 NOTE — Progress Notes (Signed)
    Date:  08/18/2018   Name:  Darlene Munoz   DOB:  1981-04-02   MRN:  [id number]   Chief Complaint: Hypertension (6 month follow up. )  Hypertension  This is a chronic problem. The problem is unchanged. The problem is controlled. Pertinent negatives include no chest pain, palpitations or shortness of breath.    Review of Systems  Constitutional: Negative for chills, fatigue, fever and unexpected weight change (but has cut back portions and has lost about 8 lbs).  Respiratory: Negative for choking, chest tightness and shortness of breath.   Cardiovascular: Negative for chest pain, palpitations and leg swelling.  Gastrointestinal: Negative for abdominal pain.  Neurological: Negative for dizziness and light-headedness.  Psychiatric/Behavioral: Negative for dysphoric mood and sleep disturbance.    Patient Active Problem List   Diagnosis Date Noted  . 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 Benign hypertension 09/30/2015  . 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 Family planning 09/30/2015  . Elevated LFTs 09/30/2015  . Hyperlipidemia, mixed 09/30/2015    No Known Allergies  History reviewed. No pertinent surgical history.  Social History   Tobacco Use  . Smoking status: Never Smoker  . Smokeless tobacco: Never Used  Substance Use Topics  . Alcohol use: No    Alcohol/week: 0.0 standard drinks  . Drug use: Not on file     Medication list has been reviewed and updated.  Current Meds  Medication Sig  . cetirizine (ZYRTEC) 10 MG tablet Take 10 mg by mouth daily.  . hydrochlorothiazide (HYDRODIURIL) 25 MG tablet Take 1 tablet (25 mg total) by mouth daily.  Marland Kitchen lisinopril (PRINIVIL,ZESTRIL) 20 MG tablet Take 1 tablet (20 mg total) by mouth daily.  . metoprolol succinate (TOPROL-XL) 25 MG 24 hr tablet Take 1 tablet (25 mg total) by mouth daily.  . Norethindrone Acetate-Ethinyl Estradiol (JUNEL 1.5/30) 1.5-30 MG-MCG tablet Take 1 tablet by mouth daily.    PHQ 2/9 Scores 08/18/2018  07/29/2017  PHQ - 2 Score 0 0    Physical Exam  Constitutional: She is oriented to person, place, and time. She appears well-developed. No distress.  HENT:  Head: Normocephalic and atraumatic.  Neck: Normal range of motion. Neck supple.  Cardiovascular: Normal rate, regular rhythm and normal heart sounds.  Pulmonary/Chest: Effort normal and breath sounds normal. No respiratory distress.  Musculoskeletal: Normal range of motion. She exhibits no edema or tenderness.  Lymphadenopathy:    She has no cervical adenopathy.  Neurological: She is alert and oriented to person, place, and time.  Skin: Skin is warm and dry. No rash noted.  Psychiatric: She has a normal mood and affect. Her behavior is normal. Thought content normal.  Nursing note and vitals reviewed.   BP 116/62 (BP Location: Right Arm, Patient Position: Sitting, Cuff Size: Normal)   Pulse 98   Ht 5' 7.5" (1.715 m)   Wt 212 lb (96.2 kg)   SpO2 98%   BMI 32.71 kg/m   Assessment and Plan: 1. Benign hypertension Controlled, continue current medications - Comprehensive metabolic panel - TSH  2. Hyperlipidemia, mixed Continue healthy low fat diet - Lipid panel   Partially dictated using Editor, commissioning. Any errors are unintentional.  Halina Maidens, MD [facility]  08/18/2018

## 2018-09-08 DIAGNOSIS — H5213 Myopia, bilateral: Secondary | ICD-10-CM | POA: Diagnosis not present

## 2018-09-17 ENCOUNTER — Other Ambulatory Visit: Payer: Self-pay | Admitting: Internal Medicine

## 2018-09-17 ENCOUNTER — Encounter: Payer: Self-pay | Admitting: Internal Medicine

## 2018-09-17 MED ORDER — METOPROLOL SUCCINATE ER 25 MG PO TB24: 25.0000 mg | ORAL_TABLET | Freq: Every day | ORAL | 3 refills | Status: DC

## 2018-09-17 MED ORDER — LISINOPRIL 20 MG PO TABS: 20.0000 mg | ORAL_TABLET | Freq: Every day | ORAL | 3 refills | Status: DC

## 2018-09-17 MED ORDER — HYDROCHLOROTHIAZIDE 25 MG PO TABS
25.0000 mg | ORAL_TABLET | Freq: Every day | ORAL | 3 refills | Status: DC
Start: 2018-09-17 — End: 2019-09-25

## 2018-09-17 MED ORDER — NORETHINDRONE ACET-ETHINYL EST 1.5-30 MG-MCG PO TABS: 1.0000 | ORAL_TABLET | Freq: Every day | ORAL | 3 refills | Status: DC

## 2018-11-18 ENCOUNTER — Encounter: Payer: Self-pay | Admitting: Internal Medicine

## 2019-02-24 ENCOUNTER — Encounter: Payer: 59 | Admitting: Internal Medicine

## 2019-06-22 ENCOUNTER — Ambulatory Visit (INDEPENDENT_AMBULATORY_CARE_PROVIDER_SITE_OTHER): Payer: 59 | Admitting: Internal Medicine

## 2019-06-22 ENCOUNTER — Other Ambulatory Visit: Payer: Self-pay

## 2019-06-22 ENCOUNTER — Encounter: Payer: Self-pay | Admitting: Internal Medicine

## 2019-06-22 VITALS — BP 128/84 | HR 90 | Ht 67.5 in | Wt 221.0 lb

## 2019-06-22 DIAGNOSIS — D693 Immune thrombocytopenic purpura: Secondary | ICD-10-CM

## 2019-06-22 DIAGNOSIS — I10 Essential (primary) hypertension: Secondary | ICD-10-CM

## 2019-06-22 DIAGNOSIS — Z0000 Encounter for general adult medical examination without abnormal findings: Secondary | ICD-10-CM

## 2019-06-22 DIAGNOSIS — G43109 Migraine with aura, not intractable, without status migrainosus: Secondary | ICD-10-CM | POA: Diagnosis not present

## 2019-06-22 DIAGNOSIS — E782 Mixed hyperlipidemia: Secondary | ICD-10-CM

## 2019-06-22 DIAGNOSIS — R946 Abnormal results of thyroid function studies: Secondary | ICD-10-CM | POA: Diagnosis not present

## 2019-06-22 LAB — POCT URINALYSIS DIPSTICK
Bilirubin, UA: NEGATIVE
Glucose, UA: NEGATIVE
Ketones, UA: NEGATIVE
Leukocytes, UA: NEGATIVE
Nitrite, UA: NEGATIVE
Protein, UA: NEGATIVE
Spec Grav, UA: 1.015 (ref 1.010–1.025)
Urobilinogen, UA: 0.2 E.U./dL
pH, UA: 5 (ref 5.0–8.0)

## 2019-06-22 NOTE — Progress Notes (Signed)
Date:  06/22/2019   Name:  Darlene Munoz   DOB:  Jun 27, 1981   MRN:  [id number]   Chief Complaint: Annual Exam (Breast Exam- No pap- 4 more years) Darlene Munoz is a 38 y.o. female who presents today for her Complete Annual Exam. She feels well. She reports exercising rarely but now restarting. She reports she is sleeping well.  She denies breast issues.  Menses are regular.  Pap  02/2018 Tdap  02/2018  Hypertension This is a chronic problem. The problem is controlled (normal readings at home). Associated symptoms include headaches (occasional migraine). Pertinent negatives include no chest pain, palpitations or shortness of breath. There are no associated agents to hypertension. Risk factors for coronary artery disease include sedentary lifestyle (plans to start exercising more regularly). Past treatments include beta blockers, diuretics and ACE inhibitors. The current treatment provides significant improvement.  Migraine  This is a recurrent problem. Episode frequency: about once per month. The problem has been unchanged. The pain quality is similar to prior headaches. The quality of the pain is described as band-like. The pain is moderate. Associated symptoms include nausea and a visual change. Pertinent negatives include no abdominal pain, coughing, dizziness, fever, hearing loss, tinnitus or vomiting. She has tried NSAIDs for the symptoms. The treatment provided significant relief. Her past medical history is significant for hypertension.  ITP - denies bleeding issues, heavy menses, significant change in headaches, easy bruising, etc.  Platelets have been stable for some time - will continue annual monitoring. Lab Results  Component Value Date   CREATININE 0.99 08/18/2018   BUN 14 08/18/2018   NA 138 08/18/2018   K 3.6 08/18/2018   CL 98 08/18/2018   CO2 27 08/18/2018   Lab Results  Component Value Date   WBC 7.5 02/18/2018   HGB 13.9 02/18/2018   HCT 40.5 02/18/2018   MCV 88  02/18/2018   PLT 278 02/18/2018   Lab Results  Component Value Date   TSH 2.049 08/18/2018   Lab Results  Component Value Date   CHOL 264 (H) 08/18/2018   HDL 48 08/18/2018   LDLCALC 177 (H) 08/18/2018   TRIG 196 (H) 08/18/2018   CHOLHDL 5.5 08/18/2018     Review of Systems  Constitutional: Negative for chills, fatigue and fever.  HENT: Negative for congestion, hearing loss, tinnitus, trouble swallowing and voice change.   Eyes: Negative for visual disturbance.  Respiratory: Negative for cough, chest tightness, shortness of breath and wheezing.   Cardiovascular: Negative for chest pain, palpitations and leg swelling.  Gastrointestinal: Positive for nausea. Negative for abdominal pain, constipation, diarrhea and vomiting.  Endocrine: Negative for polydipsia and polyuria.  Genitourinary: Negative for dysuria, frequency, genital sores, vaginal bleeding and vaginal discharge.  Musculoskeletal: Negative for arthralgias, gait problem and joint swelling.  Skin: Negative for color change and rash.  Allergic/Immunologic: Negative for environmental allergies.  Neurological: Positive for headaches (occasional migraine). Negative for dizziness, tremors and light-headedness.  Hematological: Negative for adenopathy. Does not bruise/bleed easily.  Psychiatric/Behavioral: Negative for dysphoric mood and sleep disturbance. The patient is not nervous/anxious.     Patient Active Problem List   Diagnosis Date Noted  . 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 Benign hypertension 09/30/2015  . 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 Family planning 09/30/2015  . Elevated LFTs 09/30/2015  . Hyperlipidemia, mixed 09/30/2015    No Known Allergies  History reviewed. No pertinent surgical history.  Social History   Tobacco Use  . Smoking status: Never  Smoker  . Smokeless tobacco: Never Used  Substance Use Topics  . Alcohol use: No    Alcohol/week: 0.0 standard drinks  . Drug use: Not on file      Medication list has been reviewed and updated.  Current Meds  Medication Sig  . cetirizine (ZYRTEC) 10 MG tablet Take 10 mg by mouth daily.  . hydrochlorothiazide (HYDRODIURIL) 25 MG tablet Take 1 tablet (25 mg total) by mouth daily.  Marland Kitchen lisinopril (PRINIVIL,ZESTRIL) 20 MG tablet Take 1 tablet (20 mg total) by mouth daily.  . metoprolol succinate (TOPROL-XL) 25 MG 24 hr tablet Take 1 tablet (25 mg total) by mouth daily.  . Norethindrone Acetate-Ethinyl Estradiol (JUNEL 1.5/30) 1.5-30 MG-MCG tablet Take 1 tablet by mouth daily.    PHQ 2/9 Scores 06/22/2019 08/18/2018 07/29/2017  PHQ - 2 Score 0 0 0    BP Readings from Last 3 Encounters:  06/22/19 128/84  08/18/18 116/62  02/18/18 138/78    Physical Exam Vitals signs and nursing note reviewed.  Constitutional:      General: She is not in acute distress.    Appearance: She is well-developed.  HENT:     Head: Normocephalic and atraumatic.     Right Ear: Tympanic membrane and ear canal normal.     Left Ear: Tympanic membrane and ear canal normal.     Nose:     Right Sinus: No maxillary sinus tenderness.     Left Sinus: No maxillary sinus tenderness.     Mouth/Throat:     Pharynx: Uvula midline.  Eyes:     General: No scleral icterus.       Right eye: No discharge.        Left eye: No discharge.     Conjunctiva/sclera: Conjunctivae normal.  Neck:     Musculoskeletal: Normal range of motion. No erythema.     Thyroid: No thyromegaly.     Vascular: No carotid bruit.  Cardiovascular:     Rate and Rhythm: Normal rate and regular rhythm.     Pulses: Normal pulses.     Heart sounds: Normal heart sounds.  Pulmonary:     Effort: Pulmonary effort is normal. No respiratory distress.     Breath sounds: No wheezing.  Chest:     Breasts:        Right: No mass, nipple discharge, skin change or tenderness.        Left: No mass, nipple discharge, skin change or tenderness.  Abdominal:     General: Bowel sounds are normal.      Palpations: Abdomen is soft.     Tenderness: There is no abdominal tenderness.  Musculoskeletal: Normal range of motion.     Right lower leg: No edema.     Left lower leg: No edema.  Lymphadenopathy:     Cervical: No cervical adenopathy.  Skin:    General: Skin is warm and dry.     Capillary Refill: Capillary refill takes less than 2 seconds.     Findings: No rash.  Neurological:     General: No focal deficit present.     Mental Status: She is alert and oriented to person, place, and time.     Cranial Nerves: No cranial nerve deficit.     Sensory: No sensory deficit.     Deep Tendon Reflexes: Reflexes are normal and symmetric.  Psychiatric:        Speech: Speech normal.        Behavior: Behavior normal.  Thought Content: Thought content normal.     Wt Readings from Last 3 Encounters:  06/22/19 221 lb (100.2 kg)  08/18/18 212 lb (96.2 kg)  02/18/18 220 lb (99.8 kg)    BP 128/84   Pulse (!) 129   Ht 5' 7.5" (1.715 m)   Wt 221 lb (100.2 kg)   LMP 06/21/2019 (Exact Date)   SpO2 98%   BMI 34.10 kg/m   Assessment and Plan: 1. Annual physical exam Normal exam except for weight Work on diet and exercise - POCT urinalysis dipstick  2. Benign hypertension Heart rate elevated due to anxiety - pt started normal 60-80 at home on apple watch Continue to monitor and continue triple therapy with beta blocker - Comprehensive metabolic panel - TSH  3. Immune thrombocytopenia (HCC) Platelets levels have been stable for several years Will continue to monitor yearly unless there is a change in sx - CBC with Differential/Platelet  4. Hyperlipidemia, mixed - Lipid panel  5. Migraine with aura and without status migrainosus, not intractable Slightly more often with barometric changes but still responding to Advil as needed Discussed other treatment options - triptans or newer agents   Partially dictated using Editor, commissioning. Any errors are unintentional.  Halina Maidens, MD [facility]  06/22/2019

## 2019-06-23 LAB — CBC WITH DIFFERENTIAL/PLATELET
Basophils Absolute: 0.1 10*3/uL (ref 0.0–0.2)
Basos: 1 %
EOS (ABSOLUTE): 0.1 10*3/uL (ref 0.0–0.4)
Eos: 2 %
Hematocrit: 41.9 % (ref 34.0–46.6)
Hemoglobin: 14.3 g/dL (ref 11.1–15.9)
Immature Grans (Abs): 0 10*3/uL (ref 0.0–0.1)
Immature Granulocytes: 0 %
Lymphocytes Absolute: 2.1 10*3/uL (ref 0.7–3.1)
Lymphs: 30 %
MCH: 29.7 pg (ref 26.6–33.0)
MCHC: 34.1 g/dL (ref 31.5–35.7)
MCV: 87 fL (ref 79–97)
Monocytes Absolute: 0.6 10*3/uL (ref 0.1–0.9)
Monocytes: 8 %
Neutrophils Absolute: 4.3 10*3/uL (ref 1.4–7.0)
Neutrophils: 59 %
Platelets: 263 10*3/uL (ref 150–450)
RBC: 4.81 x10E6/uL (ref 3.77–5.28)
RDW: 12.3 % (ref 11.7–15.4)
WBC: 7.2 10*3/uL (ref 3.4–10.8)

## 2019-06-23 LAB — TSH: TSH: 4.95 u[IU]/mL — ABNORMAL HIGH (ref 0.450–4.500)

## 2019-06-23 LAB — COMPREHENSIVE METABOLIC PANEL
ALT: 85 IU/L — ABNORMAL HIGH (ref 0–32)
AST: 72 IU/L — ABNORMAL HIGH (ref 0–40)
Albumin/Globulin Ratio: 1.8 (ref 1.2–2.2)
Albumin: 4.4 g/dL (ref 3.8–4.8)
Alkaline Phosphatase: 85 IU/L (ref 39–117)
BUN/Creatinine Ratio: 13 (ref 9–23)
BUN: 13 mg/dL (ref 6–20)
Bilirubin Total: 0.3 mg/dL (ref 0.0–1.2)
CO2: 24 mmol/L (ref 20–29)
Calcium: 10 mg/dL (ref 8.7–10.2)
Chloride: 99 mmol/L (ref 96–106)
Creatinine, Ser: 1.01 mg/dL — ABNORMAL HIGH (ref 0.57–1.00)
GFR calc Af Amer: 83 mL/min/{1.73_m2} (ref >59)
GFR calc non Af Amer: 72 mL/min/{1.73_m2} (ref >59)
Globulin, Total: 2.4 g/dL (ref 1.5–4.5)
Glucose: 86 mg/dL (ref 65–99)
Potassium: 4.2 mmol/L (ref 3.5–5.2)
Sodium: 139 mmol/L (ref 134–144)
Total Protein: 6.8 g/dL (ref 6.0–8.5)

## 2019-06-23 LAB — LIPID PANEL
Chol/HDL Ratio: 5.4 ratio — ABNORMAL HIGH (ref 0.0–4.4)
Cholesterol, Total: 254 mg/dL — ABNORMAL HIGH (ref 100–199)
HDL: 47 mg/dL (ref >39)
LDL Calculated: 160 mg/dL — ABNORMAL HIGH (ref 0–99)
Triglycerides: 233 mg/dL — ABNORMAL HIGH (ref 0–149)
VLDL Cholesterol Cal: 47 mg/dL — ABNORMAL HIGH (ref 5–40)

## 2019-06-27 LAB — T4: T4, Total: 11.2 ug/dL (ref 4.5–12.0)

## 2019-06-27 LAB — SPECIMEN STATUS REPORT

## 2019-08-05 ENCOUNTER — Encounter: Payer: Self-pay | Admitting: Internal Medicine

## 2019-09-09 DIAGNOSIS — Z20828 Contact with and (suspected) exposure to other viral communicable diseases: Secondary | ICD-10-CM | POA: Diagnosis not present

## 2019-09-25 ENCOUNTER — Other Ambulatory Visit: Payer: Self-pay | Admitting: Internal Medicine

## 2019-09-26 ENCOUNTER — Other Ambulatory Visit: Payer: Self-pay | Admitting: Internal Medicine

## 2019-12-17 ENCOUNTER — Other Ambulatory Visit: Payer: Self-pay | Admitting: Internal Medicine

## 2019-12-21 ENCOUNTER — Ambulatory Visit: Payer: 59 | Admitting: Internal Medicine

## 2020-01-19 DIAGNOSIS — H5213 Myopia, bilateral: Secondary | ICD-10-CM | POA: Diagnosis not present

## 2020-02-26 ENCOUNTER — Other Ambulatory Visit: Payer: Self-pay | Admitting: Internal Medicine

## 2020-05-06 ENCOUNTER — Other Ambulatory Visit: Payer: Self-pay | Admitting: Internal Medicine

## 2020-05-06 NOTE — Telephone Encounter (Signed)
Requested Prescriptions  Pending Prescriptions Disp Refills  . JUNEL 1.5/30 1.5-30 MG-MCG tablet [Pharmacy Med Name: JUNEL 1.5/30 TAB] 84 tablet 0    Sig: TAKE 1 TABLET BY MOUTH DAILY.     OB/GYN:  Contraceptives Passed - 05/06/2020  7:33 AM      Passed - Last BP in normal range    BP Readings from Last 1 Encounters:  06/22/19 128/84         Passed - Valid encounter within last 12 months    Recent Outpatient Visits          10 months ago Annual physical exam   [facility] Glean Hess, MD   1 year ago Benign hypertension   [facility] Glean Hess, MD   2 years ago Annual physical exam   [facility] Glean Hess, MD   2 years ago Benign hypertension   [facility] Glean Hess, MD   3 years ago Annual physical exam   [facility] Glean Hess, MD      Future Appointments            In 1 month Army Melia Jesse Sans, MD [facility], [facility]

## 2020-06-24 NOTE — Progress Notes (Signed)
Date:  06/25/2020   Name:  Darlene Munoz   DOB:  1981-10-17   MRN:  [id number]   Chief Complaint: Annual Exam (breast exam/ no pap)  Darlene Munoz is a 39 y.o. female who presents today for her Complete Annual Exam. She feels fairly well. She reports exercising biking and walking x3 a week. She reports she is sleeping fairly well. Breast complaints none.  Mammogram: not indicated Pap smear: 02/2018 neg with cotesting Colonoscopy: not indicated  Immunization History  Administered Date(s) Administered  . Influenza-Unspecified 08/06/2018, 08/05/2019  . PFIZER SARS-COV-2 Vaccination 11/22/2019, 12/13/2019  . Tdap 01/14/2008, 02/18/2018    Hypertension This is a chronic problem. The problem is controlled (at home HR 60-80 and BP 130/85). Associated symptoms include headaches. Pertinent negatives include no chest pain, palpitations or shortness of breath. There are no known risk factors for coronary artery disease. Past treatments include beta blockers, ACE inhibitors and diuretics. The current treatment provides significant improvement.  Migraine  This is a recurrent problem. The problem occurs seasonly. The problem has been unchanged. The pain does not radiate. The pain quality is similar to prior headaches. The pain is mild. Associated symptoms include a visual change. Pertinent negatives include no abdominal pain, coughing, dizziness, fever, hearing loss, tinnitus or vomiting. Nothing aggravates the symptoms. She has tried NSAIDs for the symptoms. The treatment provided significant relief. Her past medical history is significant for hypertension.    Lab Results  Component Value Date   CREATININE 1.01 (H) 06/22/2019   BUN 13 06/22/2019   NA 139 06/22/2019   K 4.2 06/22/2019   CL 99 06/22/2019   CO2 24 06/22/2019   Lab Results  Component Value Date   CHOL 254 (H) 06/22/2019   HDL 47 06/22/2019   LDLCALC 160 (H) 06/22/2019   TRIG 233 (H) 06/22/2019   CHOLHDL 5.4 (H)  06/22/2019   Lab Results  Component Value Date   TSH 4.950 (H) 06/22/2019   Lab Results  Component Value Date   HGBA1C 5.4 02/18/2018   Lab Results  Component Value Date   WBC 7.2 06/22/2019   HGB 14.3 06/22/2019   HCT 41.9 06/22/2019   MCV 87 06/22/2019   PLT 263 06/22/2019   Lab Results  Component Value Date   ALT 85 (H) 06/22/2019   AST 72 (H) 06/22/2019   ALKPHOS 85 06/22/2019   BILITOT 0.3 06/22/2019     Review of Systems  Constitutional: Negative for chills, fatigue and fever.  HENT: Negative for congestion, hearing loss, tinnitus, trouble swallowing and voice change.   Eyes: Negative for visual disturbance.  Respiratory: Negative for cough, chest tightness, shortness of breath and wheezing.   Cardiovascular: Negative for chest pain, palpitations and leg swelling.  Gastrointestinal: Negative for abdominal pain, constipation, diarrhea and vomiting.  Endocrine: Negative for polydipsia and polyuria.  Genitourinary: Negative for dysuria, frequency, genital sores and menstrual problem.  Musculoskeletal: Negative for arthralgias, gait problem and joint swelling.  Skin: Negative for color change and rash.  Neurological: Positive for headaches. Negative for dizziness, tremors and light-headedness.  Hematological: Negative for adenopathy. Bruises/bleeds easily.  Psychiatric/Behavioral: Negative for dysphoric mood and sleep disturbance. The patient is not nervous/anxious.     Patient Active Problem List   Diagnosis Date Noted  . Migraine with aura and without status migrainosus, not intractable 06/25/2020  . 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 Benign hypertension 09/30/2015  . 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 Family planning 09/30/2015  . Elevated LFTs  09/30/2015  . Hyperlipidemia, mixed 09/30/2015    No Known Allergies  History reviewed. No pertinent surgical history.  Social History   Tobacco Use  . Smoking status: Never Smoker  . Smokeless tobacco: Never  Used  Vaping Use  . Vaping Use: Never used  Substance Use Topics  . Alcohol use: No    Alcohol/week: 0.0 standard drinks  . Drug use: Not on file     Medication list has been reviewed and updated.  Current Meds  Medication Sig  . cetirizine (ZYRTEC) 10 MG tablet Take 10 mg by mouth as needed.   . hydrochlorothiazide (HYDRODIURIL) 25 MG tablet TAKE 1 TABLET (25 MG TOTAL) BY MOUTH DAILY.  Marland Kitchen lisinopril (ZESTRIL) 20 MG tablet TAKE 1 TABLET (20 MG TOTAL) BY MOUTH DAILY.  . metoprolol succinate (TOPROL-XL) 25 MG 24 hr tablet TAKE 1 TABLET (25 MG TOTAL) BY MOUTH DAILY.  Marland Kitchen Norethindrone Acetate-Ethinyl Estradiol (JUNEL 1.5/30) 1.5-30 MG-MCG tablet Take 1 tablet by mouth daily.  . [DISCONTINUED] JUNEL 1.5/30 1.5-30 MG-MCG tablet TAKE 1 TABLET BY MOUTH DAILY.    PHQ 2/9 Scores 06/25/2020 06/22/2019 08/18/2018 07/29/2017  PHQ - 2 Score 0 0 0 0  PHQ- 9 Score 0 - - -    GAD 7 : Generalized Anxiety Score 06/25/2020  Nervous, Anxious, on Edge 0  Control/stop worrying 0  Worry too much - different things 0  Trouble relaxing 0  Restless 0  Easily annoyed or irritable 0  Afraid - awful might happen 0  Total GAD 7 Score 0  Anxiety Difficulty Not difficult at all    BP Readings from Last 3 Encounters:  06/25/20 136/86  06/22/19 128/84  08/18/18 116/62    Physical Exam Vitals and nursing note reviewed.  Constitutional:      General: She is not in acute distress.    Appearance: She is well-developed.  HENT:     Head: Normocephalic and atraumatic.     Right Ear: Tympanic membrane and ear canal normal.     Left Ear: Tympanic membrane and ear canal normal.     Nose:     Right Sinus: No maxillary sinus tenderness.     Left Sinus: No maxillary sinus tenderness.  Eyes:     General: No scleral icterus.       Right eye: No discharge.        Left eye: No discharge.     Conjunctiva/sclera: Conjunctivae normal.  Neck:     Thyroid: No thyromegaly.     Vascular: No carotid bruit.    Cardiovascular:     Rate and Rhythm: Normal rate and regular rhythm.     Pulses: Normal pulses.     Heart sounds: Normal heart sounds.  Pulmonary:     Effort: Pulmonary effort is normal. No respiratory distress.     Breath sounds: No wheezing.  Chest:     Breasts:        Right: No mass, nipple discharge, skin change or tenderness.        Left: No mass, nipple discharge, skin change or tenderness.  Abdominal:     General: Bowel sounds are normal.     Palpations: Abdomen is soft.     Tenderness: There is no abdominal tenderness.  Musculoskeletal:     Cervical back: Normal range of motion. No erythema.     Right lower leg: No edema.     Left lower leg: No edema.  Lymphadenopathy:     Cervical: No cervical adenopathy.  Skin:    General: Skin is warm and dry.     Capillary Refill: Capillary refill takes less than 2 seconds.     Findings: No rash.  Neurological:     General: No focal deficit present.     Mental Status: She is alert and oriented to person, place, and time.     Cranial Nerves: No cranial nerve deficit.     Sensory: No sensory deficit.     Deep Tendon Reflexes: Reflexes are normal and symmetric.  Psychiatric:        Attention and Perception: Attention normal.        Mood and Affect: Mood normal.     Wt Readings from Last 3 Encounters:  06/25/20 234 lb (106.1 kg)  06/22/19 221 lb (100.2 kg)  08/18/18 212 lb (96.2 kg)    BP 136/86   Pulse 95   Temp (!) 97.3 F (36.3 C) (Oral)   Ht 5' 7.5" (1.715 m)   Wt 234 lb (106.1 kg)   LMP 06/06/2020   SpO2 98%   BMI 36.11 kg/m   Assessment and Plan: 1. Annual physical exam Exam is normal except for weight. Encourage regular exercise and appropriate dietary changes. - POCT urinalysis dipstick  2. Benign hypertension Clinically stable exam with well controlled BP on three agents. Tolerating medications without side effects at this time. Pt to continue current regimen and low sodium diet; benefits of regular  exercise as able discussed. - CBC with Differential/Platelet - TSH  3. Hyperlipidemia, mixed Continue to work on low fat diet, regular exercise - Lipid panel  4. Elevated LFTs monitoring - Comprehensive metabolic panel  5. Immune thrombocytopenia (HCC) Mild bruising notes but no other issues - CBC with Differential/Platelet  6. Need for hepatitis C screening test - Hepatitis C antibody  7. Migraine with aura and without status migrainosus, not intractable Has migraine with aura about every three months Takes Advil immediately with good response and no side effects   Partially dictated using Editor, commissioning. Any errors are unintentional.  Halina Maidens, MD [facility]  06/25/2020

## 2020-06-25 ENCOUNTER — Ambulatory Visit (INDEPENDENT_AMBULATORY_CARE_PROVIDER_SITE_OTHER): Payer: 59 | Admitting: Internal Medicine

## 2020-06-25 ENCOUNTER — Other Ambulatory Visit: Payer: Self-pay

## 2020-06-25 ENCOUNTER — Encounter: Payer: Self-pay | Admitting: Internal Medicine

## 2020-06-25 VITALS — BP 136/86 | HR 95 | Temp 97.3°F | Ht 67.5 in | Wt 234.0 lb

## 2020-06-25 DIAGNOSIS — D693 Immune thrombocytopenic purpura: Secondary | ICD-10-CM

## 2020-06-25 DIAGNOSIS — Z1159 Encounter for screening for other viral diseases: Secondary | ICD-10-CM

## 2020-06-25 DIAGNOSIS — R7989 Other specified abnormal findings of blood chemistry: Secondary | ICD-10-CM | POA: Diagnosis not present

## 2020-06-25 DIAGNOSIS — G43109 Migraine with aura, not intractable, without status migrainosus: Secondary | ICD-10-CM | POA: Diagnosis not present

## 2020-06-25 DIAGNOSIS — E782 Mixed hyperlipidemia: Secondary | ICD-10-CM | POA: Diagnosis not present

## 2020-06-25 DIAGNOSIS — Z0000 Encounter for general adult medical examination without abnormal findings: Secondary | ICD-10-CM | POA: Diagnosis not present

## 2020-06-25 DIAGNOSIS — I10 Essential (primary) hypertension: Secondary | ICD-10-CM | POA: Diagnosis not present

## 2020-06-25 LAB — POCT URINALYSIS DIPSTICK
Bilirubin, UA: NEGATIVE
Blood, UA: NEGATIVE
Glucose, UA: NEGATIVE
Ketones, UA: NEGATIVE
Leukocytes, UA: NEGATIVE
Nitrite, UA: NEGATIVE
Protein, UA: NEGATIVE
Spec Grav, UA: 1.01 (ref 1.010–1.025)
Urobilinogen, UA: 0.2 E.U./dL
pH, UA: 6 (ref 5.0–8.0)

## 2020-06-25 MED ORDER — NORETHINDRONE ACET-ETHINYL EST 1.5-30 MG-MCG PO TABS: 1.0000 | ORAL_TABLET | Freq: Every day | ORAL | 3 refills | Status: DC

## 2020-06-26 ENCOUNTER — Encounter: Payer: Self-pay | Admitting: Internal Medicine

## 2020-06-26 LAB — COMPREHENSIVE METABOLIC PANEL
ALT: 72 IU/L — ABNORMAL HIGH (ref 0–32)
AST: 41 IU/L — ABNORMAL HIGH (ref 0–40)
Albumin/Globulin Ratio: 1.6 (ref 1.2–2.2)
Albumin: 4.3 g/dL (ref 3.8–4.8)
Alkaline Phosphatase: 94 IU/L (ref 48–121)
BUN/Creatinine Ratio: 17 (ref 9–23)
BUN: 16 mg/dL (ref 6–20)
Bilirubin Total: 0.2 mg/dL (ref 0.0–1.2)
CO2: 25 mmol/L (ref 20–29)
Calcium: 9.4 mg/dL (ref 8.7–10.2)
Chloride: 98 mmol/L (ref 96–106)
Creatinine, Ser: 0.93 mg/dL (ref 0.57–1.00)
GFR calc Af Amer: 91 mL/min/{1.73_m2} (ref >59)
GFR calc non Af Amer: 79 mL/min/{1.73_m2} (ref >59)
Globulin, Total: 2.7 g/dL (ref 1.5–4.5)
Glucose: 88 mg/dL (ref 65–99)
Potassium: 3.8 mmol/L (ref 3.5–5.2)
Sodium: 138 mmol/L (ref 134–144)
Total Protein: 7 g/dL (ref 6.0–8.5)

## 2020-06-26 LAB — CBC WITH DIFFERENTIAL/PLATELET
Basophils Absolute: 0.1 10*3/uL (ref 0.0–0.2)
Basos: 1 %
EOS (ABSOLUTE): 0.2 10*3/uL (ref 0.0–0.4)
Eos: 3 %
Hematocrit: 40.8 % (ref 34.0–46.6)
Hemoglobin: 13.8 g/dL (ref 11.1–15.9)
Immature Grans (Abs): 0 10*3/uL (ref 0.0–0.1)
Immature Granulocytes: 0 %
Lymphocytes Absolute: 2.3 10*3/uL (ref 0.7–3.1)
Lymphs: 27 %
MCH: 29.9 pg (ref 26.6–33.0)
MCHC: 33.8 g/dL (ref 31.5–35.7)
MCV: 88 fL (ref 79–97)
Monocytes Absolute: 0.6 10*3/uL (ref 0.1–0.9)
Monocytes: 7 %
Neutrophils Absolute: 5.4 10*3/uL (ref 1.4–7.0)
Neutrophils: 62 %
Platelets: 280 10*3/uL (ref 150–450)
RBC: 4.62 x10E6/uL (ref 3.77–5.28)
RDW: 12.3 % (ref 11.7–15.4)
WBC: 8.5 10*3/uL (ref 3.4–10.8)

## 2020-06-26 LAB — LIPID PANEL
Chol/HDL Ratio: 5.2 ratio — ABNORMAL HIGH (ref 0.0–4.4)
Cholesterol, Total: 245 mg/dL — ABNORMAL HIGH (ref 100–199)
HDL: 47 mg/dL (ref >39)
LDL Chol Calc (NIH): 167 mg/dL — ABNORMAL HIGH (ref 0–99)
Triglycerides: 170 mg/dL — ABNORMAL HIGH (ref 0–149)
VLDL Cholesterol Cal: 31 mg/dL (ref 5–40)

## 2020-06-26 LAB — HEPATITIS C ANTIBODY: Hep C Virus Ab: <0.1 s/co ratio (ref 0.0–0.9)

## 2020-06-26 LAB — TSH: TSH: 5.78 u[IU]/mL — ABNORMAL HIGH (ref 0.450–4.500)

## 2020-06-30 LAB — SPECIMEN STATUS REPORT

## 2020-06-30 LAB — T4: T4, Total: 9.9 ug/dL (ref 4.5–12.0)

## 2020-08-03 ENCOUNTER — Ambulatory Visit (INDEPENDENT_AMBULATORY_CARE_PROVIDER_SITE_OTHER): Payer: 59

## 2020-08-03 ENCOUNTER — Other Ambulatory Visit: Payer: Self-pay

## 2020-08-03 DIAGNOSIS — Z23 Encounter for immunization: Secondary | ICD-10-CM | POA: Diagnosis not present

## 2020-08-24 ENCOUNTER — Encounter: Payer: Self-pay | Admitting: Internal Medicine

## 2020-09-30 ENCOUNTER — Other Ambulatory Visit: Payer: Self-pay | Admitting: Internal Medicine

## 2020-10-19 ENCOUNTER — Encounter: Payer: Self-pay | Admitting: Internal Medicine

## 2020-11-18 ENCOUNTER — Other Ambulatory Visit: Payer: Self-pay | Admitting: Nurse Practitioner

## 2020-11-18 DIAGNOSIS — R059 Cough, unspecified: Secondary | ICD-10-CM

## 2020-11-19 ENCOUNTER — Other Ambulatory Visit: Payer: 59

## 2020-11-19 ENCOUNTER — Other Ambulatory Visit: Payer: Self-pay

## 2020-11-19 DIAGNOSIS — R059 Cough, unspecified: Secondary | ICD-10-CM

## 2020-11-21 ENCOUNTER — Other Ambulatory Visit: Payer: Self-pay | Admitting: Nurse Practitioner

## 2020-11-21 DIAGNOSIS — R059 Cough, unspecified: Secondary | ICD-10-CM

## 2020-11-22 LAB — SARS-COV-2, NAA 2 DAY TAT

## 2020-11-22 LAB — NOVEL CORONAVIRUS, NAA: SARS-CoV-2, NAA: NOT DETECTED

## 2020-11-22 NOTE — Progress Notes (Signed)
Contacted via MyChart  Negative -- but you have the lab order in there for Friday:)

## 2020-11-23 ENCOUNTER — Other Ambulatory Visit: Payer: 59

## 2020-11-23 ENCOUNTER — Other Ambulatory Visit: Payer: Self-pay

## 2020-11-23 DIAGNOSIS — R059 Cough, unspecified: Secondary | ICD-10-CM | POA: Diagnosis not present

## 2020-11-26 LAB — NOVEL CORONAVIRUS, NAA: SARS-CoV-2, NAA: NOT DETECTED

## 2020-11-26 LAB — SARS-COV-2, NAA 2 DAY TAT

## 2020-11-26 NOTE — Progress Notes (Signed)
Contacted via [place] negative on results, hope you are feeling better.:)

## 2020-12-26 ENCOUNTER — Other Ambulatory Visit: Payer: Self-pay

## 2020-12-26 ENCOUNTER — Encounter: Payer: Self-pay | Admitting: Internal Medicine

## 2020-12-26 ENCOUNTER — Ambulatory Visit: Payer: 59 | Admitting: Internal Medicine

## 2020-12-26 VITALS — BP 128/80 | HR 115 | Ht 67.5 in | Wt 231.0 lb

## 2020-12-26 DIAGNOSIS — D693 Immune thrombocytopenic purpura: Secondary | ICD-10-CM | POA: Diagnosis not present

## 2020-12-26 DIAGNOSIS — I10 Essential (primary) hypertension: Secondary | ICD-10-CM

## 2020-12-26 DIAGNOSIS — E782 Mixed hyperlipidemia: Secondary | ICD-10-CM

## 2020-12-26 DIAGNOSIS — R7989 Other specified abnormal findings of blood chemistry: Secondary | ICD-10-CM

## 2020-12-26 NOTE — Progress Notes (Signed)
Date:  12/26/2020   Name:  Darlene Munoz   DOB:  07/19/1981   MRN:  [id number]   Chief Complaint: Hypertension (6 month follow up.) and Hypothyroidism (Repeat Thyroid labs.)  Hypertension This is a chronic problem. The problem is controlled. Pertinent negatives include no chest pain, headaches, palpitations or shortness of breath. Past treatments include ACE inhibitors, beta blockers and diuretics. The current treatment provides significant improvement. There are no compliance problems.  There is no history of kidney disease, CAD/MI or CVA.  She is now walking 5 days a week and working on diet  Elevated TSH with normal T4 noted on last visit.  She does not have any referable symptoms so will just continue to monitor.   Lab Results  Component Value Date   CREATININE 0.93 06/25/2020   BUN 16 06/25/2020   NA 138 06/25/2020   K 3.8 06/25/2020   CL 98 06/25/2020   CO2 25 06/25/2020   Lab Results  Component Value Date   CHOL 245 (H) 06/25/2020   HDL 47 06/25/2020   LDLCALC 167 (H) 06/25/2020   TRIG 170 (H) 06/25/2020   CHOLHDL 5.2 (H) 06/25/2020   Lab Results  Component Value Date   TSH 5.780 (H) 06/25/2020   Lab Results  Component Value Date   HGBA1C 5.4 02/18/2018   Lab Results  Component Value Date   WBC 8.5 06/25/2020   HGB 13.8 06/25/2020   HCT 40.8 06/25/2020   MCV 88 06/25/2020   PLT 280 06/25/2020   Lab Results  Component Value Date   ALT 72 (H) 06/25/2020   AST 41 (H) 06/25/2020   ALKPHOS 94 06/25/2020   BILITOT 0.2 06/25/2020     Review of Systems  Constitutional: Negative for fatigue and unexpected weight change.  HENT: Negative for nosebleeds.   Eyes: Negative for visual disturbance.  Respiratory: Negative for cough, chest tightness, shortness of breath and wheezing.   Cardiovascular: Negative for chest pain, palpitations and leg swelling.  Gastrointestinal: Negative for abdominal pain, constipation and diarrhea.  Genitourinary: Negative for  menstrual problem.  Neurological: Negative for dizziness, weakness, light-headedness and headaches.  Hematological: Does not bruise/bleed easily.    Patient Active Problem List   Diagnosis Date Noted  . Migraine with aura and without status migrainosus, not intractable 06/25/2020  . 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 Benign hypertension 09/30/2015  . 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 Family planning 09/30/2015  . Elevated LFTs 09/30/2015  . Hyperlipidemia, mixed 09/30/2015    No Known Allergies  History reviewed. No pertinent surgical history.  Social History   Tobacco Use  . Smoking status: Never Smoker  . Smokeless tobacco: Never Used  Vaping Use  . Vaping Use: Never used  Substance Use Topics  . Alcohol use: No    Alcohol/week: 0.0 standard drinks     Medication list has been reviewed and updated.  Current Meds  Medication Sig  . cetirizine (ZYRTEC) 10 MG tablet Take 10 mg by mouth as needed.   . hydrochlorothiazide (HYDRODIURIL) 25 MG tablet TAKE 1 TABLET (25 MG TOTAL) BY MOUTH DAILY.  Marland Kitchen lisinopril (ZESTRIL) 20 MG tablet TAKE 1 TABLET (20 MG TOTAL) BY MOUTH DAILY.  . metoprolol succinate (TOPROL-XL) 25 MG 24 hr tablet TAKE 1 TABLET (25 MG TOTAL) BY MOUTH DAILY.  Marland Kitchen Norethindrone Acetate-Ethinyl Estradiol (JUNEL 1.5/30) 1.5-30 MG-MCG tablet Take 1 tablet by mouth daily.    PHQ 2/9 Scores 12/26/2020 06/25/2020 06/22/2019 08/18/2018  PHQ - 2 Score 0  0 0 0  PHQ- 9 Score 0 0 - -    GAD 7 : Generalized Anxiety Score 12/26/2020 06/25/2020  Nervous, Anxious, on Edge 0 0  Control/stop worrying 0 0  Worry too much - different things 0 0  Trouble relaxing 0 0  Restless 0 0  Easily annoyed or irritable 0 0  Afraid - awful might happen 0 0  Total GAD 7 Score 0 0  Anxiety Difficulty Not difficult at all Not difficult at all    BP Readings from Last 3 Encounters:  12/26/20 128/80  06/25/20 136/86  06/22/19 128/84    Physical Exam Vitals and nursing note reviewed.   Constitutional:      General: She is not in acute distress.    Appearance: She is well-developed.  HENT:     Head: Normocephalic and atraumatic.  Cardiovascular:     Rate and Rhythm: Normal rate and regular rhythm.     Pulses: Normal pulses.     Heart sounds: No murmur heard.   Pulmonary:     Effort: Pulmonary effort is normal. No respiratory distress.     Breath sounds: No wheezing or rhonchi.  Musculoskeletal:     Cervical back: Normal range of motion.     Right lower leg: No edema.     Left lower leg: No edema.  Skin:    General: Skin is warm and dry.     Capillary Refill: Capillary refill takes less than 2 seconds.     Findings: No rash.  Neurological:     General: No focal deficit present.     Mental Status: She is alert and oriented to person, place, and time.  Psychiatric:        Mood and Affect: Mood normal.        Behavior: Behavior normal.     Wt Readings from Last 3 Encounters:  12/26/20 231 lb (104.8 kg)  06/25/20 234 lb (106.1 kg)  06/22/19 221 lb (100.2 kg)    BP 128/80   Pulse (!) 115   Ht 5' 7.5" (1.715 m)   Wt 231 lb (104.8 kg)   SpO2 98%   BMI 35.65 kg/m   Assessment and Plan: 1. Benign hypertension Clinically stable exam with well controlled BP. Tolerating medications without side effects at this time. Pt to continue current regimen and low sodium diet; benefits of regular exercise as able discussed.  2. Elevated TSH Will recheck and advise if supplementation is needed - TSH + free T4  3. Hyperlipidemia, mixed Continue diet modifications - Lipid panel  4. 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 Any errors are unintentional.  Halina Maidens, MD [facility]  12/26/2020

## 2020-12-27 LAB — TSH+FREE T4
Free T4: 1.31 ng/dL (ref 0.82–1.77)
TSH: 3.76 u[IU]/mL (ref 0.450–4.500)

## 2020-12-27 LAB — LIPID PANEL
Chol/HDL Ratio: 5.4 ratio — ABNORMAL HIGH (ref 0.0–4.4)
Cholesterol, Total: 241 mg/dL — ABNORMAL HIGH (ref 100–199)
HDL: 45 mg/dL (ref >39)
LDL Chol Calc (NIH): 166 mg/dL — ABNORMAL HIGH (ref 0–99)
Triglycerides: 165 mg/dL — ABNORMAL HIGH (ref 0–149)
VLDL Cholesterol Cal: 30 mg/dL (ref 5–40)

## 2021-01-09 DIAGNOSIS — H5213 Myopia, bilateral: Secondary | ICD-10-CM | POA: Diagnosis not present

## 2021-06-27 ENCOUNTER — Encounter: Payer: 59 | Admitting: Internal Medicine

## 2021-06-28 ENCOUNTER — Ambulatory Visit (INDEPENDENT_AMBULATORY_CARE_PROVIDER_SITE_OTHER): Payer: 59 | Admitting: Internal Medicine

## 2021-06-28 ENCOUNTER — Encounter: Payer: Self-pay | Admitting: Internal Medicine

## 2021-06-28 ENCOUNTER — Other Ambulatory Visit: Payer: Self-pay

## 2021-06-28 VITALS — BP 132/80 | HR 100 | Temp 97.6°F | Ht 67.5 in | Wt 237.0 lb

## 2021-06-28 DIAGNOSIS — Z0000 Encounter for general adult medical examination without abnormal findings: Secondary | ICD-10-CM | POA: Diagnosis not present

## 2021-06-28 DIAGNOSIS — I10 Essential (primary) hypertension: Secondary | ICD-10-CM

## 2021-06-28 DIAGNOSIS — D693 Immune thrombocytopenic purpura: Secondary | ICD-10-CM | POA: Diagnosis not present

## 2021-06-28 DIAGNOSIS — Z114 Encounter for screening for human immunodeficiency virus [HIV]: Secondary | ICD-10-CM | POA: Diagnosis not present

## 2021-06-28 DIAGNOSIS — E782 Mixed hyperlipidemia: Secondary | ICD-10-CM

## 2021-06-28 LAB — POCT URINALYSIS DIPSTICK
Bilirubin, UA: NEGATIVE
Glucose, UA: NEGATIVE
Ketones, UA: NEGATIVE
Leukocytes, UA: NEGATIVE
Nitrite, UA: NEGATIVE
Protein, UA: NEGATIVE
Spec Grav, UA: 1.01 (ref 1.010–1.025)
Urobilinogen, UA: 0.2 E.U./dL
pH, UA: 6.5 (ref 5.0–8.0)

## 2021-06-28 MED ORDER — NORETHINDRONE ACET-ETHINYL EST 1.5-30 MG-MCG PO TABS: 1.0000 | ORAL_TABLET | Freq: Every day | ORAL | 3 refills | Status: DC

## 2021-06-28 NOTE — Progress Notes (Signed)
Date:  06/28/2021   Name:  Darlene Munoz   DOB:  April 25, 1981   MRN:  [id number]   Chief Complaint: Annual Exam (Breast exam no pap, HIV screening ) Darlene Munoz is a 40 y.o. female who presents today for her Complete Annual Exam. She feels well. She reports exercising 30 mins of cardio 5 days a week . She reports she is sleeping well. Breast complaints none. Menses are regular on OCPs.  Mammogram: not due DEXA: not due Pap smear: 02/2018 neg with co-testing Colonoscopy: not due  Immunization History  Administered Date(s) Administered   Influenza,inj,Quad PF,6+ Mos 08/03/2020   Influenza-Unspecified 08/06/2018, 08/05/2019   PFIZER(Purple Top)SARS-COV-2 Vaccination 11/22/2019, 12/13/2019, 08/24/2020   Tdap 01/14/2008, 02/18/2018    Hypertension This is a chronic problem. The problem is controlled. Associated symptoms include headaches (respond to Advil). Pertinent negatives include no chest pain, palpitations or shortness of breath. Past treatments include diuretics, beta blockers and ACE inhibitors. The current treatment provides significant improvement.  Migraine  This is a recurrent problem. The problem occurs seasonly. The problem has been unchanged. Associated symptoms include a visual change (aura followed by headache). Pertinent negatives include no abdominal pain, coughing, dizziness, fever, hearing loss, tinnitus or vomiting. She has tried NSAIDs for the symptoms. The treatment provided significant relief. Her past medical history is significant for hypertension.   Lab Results  Component Value Date   CREATININE 0.93 06/25/2020   BUN 16 06/25/2020   NA 138 06/25/2020   K 3.8 06/25/2020   CL 98 06/25/2020   CO2 25 06/25/2020   Lab Results  Component Value Date   CHOL 241 (H) 12/26/2020   HDL 45 12/26/2020   LDLCALC 166 (H) 12/26/2020   TRIG 165 (H) 12/26/2020   CHOLHDL 5.4 (H) 12/26/2020   Lab Results  Component Value Date   TSH 3.760 12/26/2020   Lab Results   Component Value Date   HGBA1C 5.4 02/18/2018   Lab Results  Component Value Date   WBC 8.5 06/25/2020   HGB 13.8 06/25/2020   HCT 40.8 06/25/2020   MCV 88 06/25/2020   PLT 280 06/25/2020   Lab Results  Component Value Date   ALT 72 (H) 06/25/2020   AST 41 (H) 06/25/2020   ALKPHOS 94 06/25/2020   BILITOT 0.2 06/25/2020     Review of Systems  Constitutional:  Negative for chills, fatigue and fever.  HENT:  Negative for congestion, hearing loss, tinnitus, trouble swallowing and voice change.   Eyes:  Negative for visual disturbance.  Respiratory:  Negative for cough, chest tightness, shortness of breath and wheezing.   Cardiovascular:  Negative for chest pain, palpitations and leg swelling.  Gastrointestinal:  Negative for abdominal pain, constipation, diarrhea and vomiting.  Endocrine: Negative for polydipsia and polyuria.  Genitourinary:  Negative for dysuria, frequency, genital sores, vaginal bleeding and vaginal discharge.  Musculoskeletal:  Negative for arthralgias, gait problem and joint swelling.  Skin:  Negative for color change and rash.  Neurological:  Positive for syncope and headaches (respond to Advil). Negative for dizziness, tremors and light-headedness.  Hematological:  Negative for adenopathy. Does not bruise/bleed easily.  Psychiatric/Behavioral:  Negative for dysphoric mood and sleep disturbance. The patient is not nervous/anxious.    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 No pertinent surgical history.  Social History   Tobacco Use   Smoking status: Never   Smokeless tobacco: Never  Vaping Use   Vaping Use: Never used   Substance Use Topics   Alcohol use: No    Alcohol/week: 0.0 standard drinks     Medication list has been reviewed and updated.  Current Meds  Medication Sig   cetirizine (ZYRTEC) 10 MG tablet Take 10 mg by mouth as needed.    hydrochlorothiazide (HYDRODIURIL) 25 MG tablet TAKE 1 TABLET (25 MG TOTAL) BY MOUTH DAILY.   lisinopril (ZESTRIL) 20 MG tablet TAKE 1 TABLET (20 MG TOTAL) BY MOUTH DAILY.   metoprolol succinate (TOPROL-XL) 25 MG 24 hr tablet TAKE 1 TABLET (25 MG TOTAL) BY MOUTH DAILY.   Norethindrone Acetate-Ethinyl Estradiol (JUNEL 1.5/30) 1.5-30 MG-MCG tablet Take 1 tablet by mouth daily.    PHQ 2/9 Scores 06/28/2021 12/26/2020 06/25/2020 06/22/2019  PHQ - 2 Score 0 0 0 0  PHQ- 9 Score 0 0 0 -    GAD 7 : Generalized Anxiety Score 06/28/2021 12/26/2020 06/25/2020  Nervous, Anxious, on Edge 0 0 0  Control/stop worrying 0 0 0  Worry too much - different things 0 0 0  Trouble relaxing 0 0 0  Restless 0 0 0  Easily annoyed or irritable 0 0 0  Afraid - awful might happen 0 0 0  Total GAD 7 Score 0 0 0  Anxiety Difficulty - Not difficult at all Not difficult at all    BP Readings from Last 3 Encounters:  06/28/21 132/80  12/26/20 128/80  06/25/20 136/86    Physical Exam Vitals and nursing note reviewed.  Constitutional:      General: She is not in acute distress.    Appearance: She is well-developed.  HENT:     Head: Normocephalic and atraumatic.     Right Ear: Tympanic membrane and ear canal normal.     Left Ear: Tympanic membrane and ear canal normal.     Nose:     Right Sinus: No maxillary sinus tenderness.     Left Sinus: No maxillary sinus tenderness.  Eyes:     General: No scleral icterus.       Right eye: No discharge.        Left eye: No discharge.     Conjunctiva/sclera: Conjunctivae normal.  Neck:     Thyroid: No thyromegaly.     Vascular: No carotid bruit.  Cardiovascular:     Rate and Rhythm: Normal rate and regular rhythm.     Pulses: Normal  pulses.     Heart sounds: Normal heart sounds.  Pulmonary:     Effort: Pulmonary effort is normal. No respiratory distress.     Breath sounds: No wheezing.  Chest:  Breasts:    Right: No mass, nipple discharge, skin change or tenderness.     Left: No mass, nipple discharge, skin change or tenderness.  Abdominal:     General: Bowel sounds are normal.     Palpations: Abdomen is soft.     Tenderness: There is no abdominal tenderness.  Musculoskeletal:     Cervical back: Normal range of motion. No erythema.     Right lower leg: No edema.     Left lower leg: No edema.  Lymphadenopathy:     Cervical: No cervical adenopathy.  Skin:    General: Skin is warm and  dry.     Findings: No rash.  Neurological:     Mental Status: She is alert and oriented to person, place, and time.     Cranial Nerves: No cranial nerve deficit.     Sensory: No sensory deficit.     Deep Tendon Reflexes: Reflexes are normal and symmetric.  Psychiatric:        Attention and Perception: Attention normal.        Mood and Affect: Mood normal.    Wt Readings from Last 3 Encounters:  06/28/21 237 lb (107.5 kg)  12/26/20 231 lb (104.8 kg)  06/25/20 234 lb (106.1 kg)    BP 132/80   Pulse 100   Temp 97.6 F (36.4 C) (Oral)   Ht 5' 7.5" (1.715 m)   Wt 237 lb (107.5 kg)   LMP 06/26/2021   SpO2 99%   BMI 36.57 kg/m   Assessment and Plan: 1. Annual physical exam Normal exam except for weight Continue healthy diet and exercise Pap up to date Mammogram not needed yet Immunizations are up to date - Hemoglobin A1c  2. Benign hypertension Clinically stable exam with well controlled BP. Tolerating medications without side effects at this time. Pt to continue current regimen and low sodium diet; benefits of regular exercise as able discussed. - CBC with Differential/Platelet - Comprehensive metabolic panel - TSH - POCT urinalysis dipstick  3. 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 Hyperlipidemia, mixed Check labs - continue low fat diet - Lipid panel  5. Encounter for screening for HIV - HIV Antibody (routine testing w rflx)   Partially dictated using Editor, commissioning. Any errors are unintentional.  Halina Maidens, MD [facility]  06/28/2021

## 2021-06-29 LAB — CBC WITH DIFFERENTIAL/PLATELET
Basophils Absolute: 0.1 10*3/uL (ref 0.0–0.2)
Basos: 1 %
EOS (ABSOLUTE): 0.2 10*3/uL (ref 0.0–0.4)
Eos: 2 %
Hematocrit: 43.9 % (ref 34.0–46.6)
Hemoglobin: 14.6 g/dL (ref 11.1–15.9)
Immature Grans (Abs): 0 10*3/uL (ref 0.0–0.1)
Immature Granulocytes: 0 %
Lymphocytes Absolute: 1.9 10*3/uL (ref 0.7–3.1)
Lymphs: 27 %
MCH: 29.9 pg (ref 26.6–33.0)
MCHC: 33.3 g/dL (ref 31.5–35.7)
MCV: 90 fL (ref 79–97)
Monocytes Absolute: 0.5 10*3/uL (ref 0.1–0.9)
Monocytes: 6 %
Neutrophils Absolute: 4.5 10*3/uL (ref 1.4–7.0)
Neutrophils: 64 %
Platelets: 305 10*3/uL (ref 150–450)
RBC: 4.89 x10E6/uL (ref 3.77–5.28)
RDW: 12.4 % (ref 11.7–15.4)
WBC: 7.1 10*3/uL (ref 3.4–10.8)

## 2021-06-29 LAB — COMPREHENSIVE METABOLIC PANEL
ALT: 126 IU/L — ABNORMAL HIGH (ref 0–32)
AST: 98 IU/L — ABNORMAL HIGH (ref 0–40)
Albumin/Globulin Ratio: 1.9 (ref 1.2–2.2)
Albumin: 4.5 g/dL (ref 3.8–4.8)
Alkaline Phosphatase: 109 IU/L (ref 44–121)
BUN/Creatinine Ratio: 14 (ref 9–23)
BUN: 13 mg/dL (ref 6–20)
Bilirubin Total: 0.3 mg/dL (ref 0.0–1.2)
CO2: 23 mmol/L (ref 20–29)
Calcium: 9.7 mg/dL (ref 8.7–10.2)
Chloride: 97 mmol/L (ref 96–106)
Creatinine, Ser: 0.94 mg/dL (ref 0.57–1.00)
Globulin, Total: 2.4 g/dL (ref 1.5–4.5)
Glucose: 94 mg/dL (ref 65–99)
Potassium: 4.3 mmol/L (ref 3.5–5.2)
Sodium: 137 mmol/L (ref 134–144)
Total Protein: 6.9 g/dL (ref 6.0–8.5)
eGFR: 80 mL/min/{1.73_m2} (ref >59)

## 2021-06-29 LAB — HIV ANTIBODY (ROUTINE TESTING W REFLEX): HIV Screen 4th Generation wRfx: NONREACTIVE

## 2021-06-29 LAB — LIPID PANEL
Chol/HDL Ratio: 5.4 ratio — ABNORMAL HIGH (ref 0.0–4.4)
Cholesterol, Total: 248 mg/dL — ABNORMAL HIGH (ref 100–199)
HDL: 46 mg/dL (ref >39)
LDL Chol Calc (NIH): 169 mg/dL — ABNORMAL HIGH (ref 0–99)
Triglycerides: 180 mg/dL — ABNORMAL HIGH (ref 0–149)
VLDL Cholesterol Cal: 33 mg/dL (ref 5–40)

## 2021-06-29 LAB — TSH: TSH: 3.02 u[IU]/mL (ref 0.450–4.500)

## 2021-06-29 LAB — HEMOGLOBIN A1C
Est. average glucose Bld gHb Est-mCnc: 111 mg/dL
Hgb A1c MFr Bld: 5.5 % (ref 4.8–5.6)

## 2021-07-01 ENCOUNTER — Other Ambulatory Visit: Payer: Self-pay | Admitting: Internal Medicine

## 2021-07-01 DIAGNOSIS — R7989 Other specified abnormal findings of blood chemistry: Secondary | ICD-10-CM

## 2021-07-08 ENCOUNTER — Ambulatory Visit: Payer: 59

## 2021-07-10 ENCOUNTER — Ambulatory Visit
Admission: RE | Admit: 2021-07-10 | Discharge: 2021-07-10 | Disposition: A | Discharge status: Home / Self Care | Payer: 59 | Source: Ambulatory Visit | Attending: Internal Medicine | Admitting: Internal Medicine

## 2021-07-10 ENCOUNTER — Other Ambulatory Visit: Payer: Self-pay

## 2021-07-10 DIAGNOSIS — R7989 Other specified abnormal findings of blood chemistry: Secondary | ICD-10-CM | POA: Diagnosis not present

## 2021-07-10 DIAGNOSIS — R188 Other ascites: Secondary | ICD-10-CM | POA: Diagnosis not present

## 2021-07-12 ENCOUNTER — Other Ambulatory Visit: Payer: Self-pay | Admitting: Internal Medicine

## 2021-07-12 ENCOUNTER — Telehealth: Payer: Self-pay

## 2021-07-12 DIAGNOSIS — K769 Liver disease, unspecified: Secondary | ICD-10-CM

## 2021-07-12 NOTE — Telephone Encounter (Signed)
Dr. Army Melia informed and spoke with patient over the phone.

## 2021-07-12 NOTE — Telephone Encounter (Signed)
Darlene Munoz with [facility] calling with results from Abdominal Ultrasound. Results in Epic. Juliann Pulse in the practice notified. Will forward to PCP.

## 2021-07-14 ENCOUNTER — Encounter: Payer: Self-pay | Admitting: Internal Medicine

## 2021-07-17 ENCOUNTER — Other Ambulatory Visit: Payer: Self-pay

## 2021-07-17 DIAGNOSIS — K769 Liver disease, unspecified: Secondary | ICD-10-CM

## 2021-07-24 ENCOUNTER — Ambulatory Visit: Payer: 59

## 2021-07-31 ENCOUNTER — Other Ambulatory Visit: Payer: Self-pay

## 2021-07-31 ENCOUNTER — Ambulatory Visit
Admission: RE | Admit: 2021-07-31 | Discharge: 2021-07-31 | Disposition: A | Discharge status: Home / Self Care | Payer: 59 | Source: Ambulatory Visit | Attending: Internal Medicine | Admitting: Internal Medicine

## 2021-07-31 DIAGNOSIS — K769 Liver disease, unspecified: Secondary | ICD-10-CM | POA: Insufficient documentation

## 2021-07-31 DIAGNOSIS — K760 Fatty (change of) liver, not elsewhere classified: Secondary | ICD-10-CM | POA: Diagnosis not present

## 2021-07-31 MED ORDER — GADOBUTROL 1 MMOL/ML IV SOLN
10.0000 mL | Freq: Once | INTRAVENOUS | Status: AC | PRN
  Administered 2021-07-31: 10 mL via INTRAVENOUS

## 2021-08-01 ENCOUNTER — Telehealth: Payer: Self-pay

## 2021-08-01 ENCOUNTER — Other Ambulatory Visit: Payer: Self-pay

## 2021-08-01 DIAGNOSIS — K769 Liver disease, unspecified: Secondary | ICD-10-CM

## 2021-08-01 NOTE — Telephone Encounter (Signed)
Diane with [facility] calling with call report on MRI 07/31/21. Results in Epic. Levada Dy in the practice notified. Will forward to PCP.

## 2021-08-06 ENCOUNTER — Encounter: Payer: Self-pay | Admitting: Internal Medicine

## 2021-08-06 DIAGNOSIS — K760 Fatty (change of) liver, not elsewhere classified: Secondary | ICD-10-CM | POA: Diagnosis not present

## 2021-08-06 DIAGNOSIS — D134 Benign neoplasm of liver: Secondary | ICD-10-CM | POA: Insufficient documentation

## 2021-08-06 DIAGNOSIS — R160 Hepatomegaly, not elsewhere classified: Secondary | ICD-10-CM | POA: Diagnosis not present

## 2021-08-16 ENCOUNTER — Encounter: Payer: Self-pay | Admitting: Internal Medicine

## 2021-09-13 ENCOUNTER — Other Ambulatory Visit: Payer: 59

## 2021-09-13 ENCOUNTER — Encounter: Payer: Self-pay | Admitting: Internal Medicine

## 2021-09-13 ENCOUNTER — Other Ambulatory Visit: Payer: Self-pay

## 2021-09-13 ENCOUNTER — Other Ambulatory Visit: Payer: Self-pay | Admitting: Nurse Practitioner

## 2021-09-13 DIAGNOSIS — R6883 Chills (without fever): Secondary | ICD-10-CM

## 2021-09-13 LAB — VERITOR FLU A/B WAIVED
Influenza A: NEGATIVE
Influenza B: NEGATIVE

## 2021-09-13 NOTE — Progress Notes (Signed)
Having chills, will check for flu.

## 2021-09-15 ENCOUNTER — Other Ambulatory Visit: Payer: Self-pay | Admitting: Internal Medicine

## 2021-09-15 ENCOUNTER — Encounter: Payer: Self-pay | Admitting: Internal Medicine

## 2021-10-25 ENCOUNTER — Other Ambulatory Visit: Payer: Self-pay | Admitting: General Surgery

## 2021-10-25 DIAGNOSIS — R160 Hepatomegaly, not elsewhere classified: Secondary | ICD-10-CM

## 2021-11-26 ENCOUNTER — Ambulatory Visit
Admission: RE | Admit: 2021-11-26 | Discharge: 2021-11-26 | Disposition: A | Discharge status: Home / Self Care | Payer: 59 | Source: Ambulatory Visit | Attending: General Surgery | Admitting: General Surgery

## 2021-11-26 DIAGNOSIS — R160 Hepatomegaly, not elsewhere classified: Secondary | ICD-10-CM

## 2021-11-26 DIAGNOSIS — K7689 Other specified diseases of liver: Secondary | ICD-10-CM | POA: Diagnosis not present

## 2021-11-26 MED ORDER — GADOXETATE DISODIUM 0.25 MMOL/ML IV SOLN
10.0000 mL | Freq: Once | INTRAVENOUS | Status: AC | PRN
  Administered 2021-11-26: 10 mL via INTRAVENOUS

## 2021-12-16 DIAGNOSIS — K760 Fatty (change of) liver, not elsewhere classified: Secondary | ICD-10-CM | POA: Diagnosis not present

## 2021-12-16 DIAGNOSIS — R160 Hepatomegaly, not elsewhere classified: Secondary | ICD-10-CM | POA: Diagnosis not present

## 2021-12-20 ENCOUNTER — Other Ambulatory Visit: Payer: Self-pay | Admitting: Internal Medicine

## 2021-12-20 NOTE — Telephone Encounter (Signed)
Requested medication (s) are due for refill today: Yes  Requested medication (s) are on the active medication list: Yes  Last refill:  10/01/20  Future visit scheduled: Yes  Notes to clinic:  Prescriptions expired.    Requested Prescriptions  Pending Prescriptions Disp Refills   lisinopril (ZESTRIL) 20 MG tablet [Pharmacy Med Name: LISINOPRIL TAB 20MG ] 90 tablet 3    Sig: TAKE 1 TABLET (20 MG TOTAL) BY MOUTH DAILY.     Cardiovascular:  ACE Inhibitors Passed - 12/20/2021  7:31 AM      Passed - Cr in normal range and within 180 days    Creatinine, Ser  Date Value Ref Range Status  06/28/2021 0.94 0.57 - 1.00 mg/dL Final          Passed - K in normal range and within 180 days    Potassium  Date Value Ref Range Status  06/28/2021 4.3 3.5 - 5.2 mmol/L Final          Passed - Patient is not pregnant      Passed - Last BP in normal range    BP Readings from Last 1 Encounters:  06/28/21 132/80          Passed - Valid encounter within last 6 months    Recent Outpatient Visits           5 months ago Annual physical exam   [facility] Glean Hess, MD   11 months ago Benign hypertension   [facility] Glean Hess, MD   1 year ago Annual physical exam   [facility] Glean Hess, MD   2 years ago Annual physical exam   [facility] Glean Hess, MD   3 years ago Benign hypertension   [facility] Glean Hess, MD       Future Appointments             In 2 weeks Army Melia Jesse Sans, MD [facility], [place]   In 6 months Glean Hess, MD [facility], [facility]             metoprolol succinate (TOPROL-XL) 25 MG 24 hr tablet [Pharmacy Med Name: METOPROLOL TAB 25MG  ER] 90 tablet 3    Sig: TAKE 1 TABLET (25 MG TOTAL) BY MOUTH DAILY.     Cardiovascular:  Beta Blockers Passed - 12/20/2021  7:31 AM      Passed - Last BP in normal range    BP Readings from Last 1 Encounters:   06/28/21 132/80          Passed - Last Heart Rate in normal range    Pulse Readings from Last 1 Encounters:  06/28/21 100          Passed - Valid encounter within last 6 months    Recent Outpatient Visits           5 months ago Annual physical exam   [facility] Glean Hess, MD   11 months ago Benign hypertension   [facility] Glean Hess, MD   1 year ago Annual physical exam   [facility] Glean Hess, MD   2 years ago Annual physical exam   [facility] Glean Hess, MD   3 years ago Benign hypertension   [facility] Glean Hess, MD       Future Appointments  In 2 weeks Glean Hess, MD [facility], [place]   In 6 months Army Melia Jesse Sans, MD [facility]             hydrochlorothiazide (HYDRODIURIL) 25 MG tablet [Pharmacy Med Name: HYDROCHLOROT TAB 25MG ] 90 tablet 3    Sig: TAKE 1 TABLET (25 MG TOTAL) BY MOUTH DAILY.     Cardiovascular: Diuretics - Thiazide Passed - 12/20/2021  7:31 AM      Passed - Cr in normal range and within 180 days    Creatinine, Ser  Date Value Ref Range Status  06/28/2021 0.94 0.57 - 1.00 mg/dL Final          Passed - K in normal range and within 180 days    Potassium  Date Value Ref Range Status  06/28/2021 4.3 3.5 - 5.2 mmol/L Final          Passed - Na in normal range and within 180 days    Sodium  Date Value Ref Range Status  06/28/2021 137 134 - 144 mmol/L Final          Passed - Last BP in normal range    BP Readings from Last 1 Encounters:  06/28/21 132/80          Passed - Valid encounter within last 6 months    Recent Outpatient Visits           5 months ago Annual physical exam   [facility] Glean Hess, MD   11 months ago Benign hypertension   [facility] Glean Hess, MD   1 year ago Annual physical exam   [facility] Glean Hess, MD   2  years ago Annual physical exam   [facility] Glean Hess, MD   3 years ago Benign hypertension   [facility] Glean Hess, MD       Future Appointments             In 2 weeks Army Melia Jesse Sans, MD [facility], [place]   In 6 months Army Melia, Jesse Sans, MD [facility], [facility]

## 2022-01-08 ENCOUNTER — Encounter: Payer: Self-pay | Admitting: Internal Medicine

## 2022-01-08 ENCOUNTER — Other Ambulatory Visit: Payer: Self-pay

## 2022-01-08 ENCOUNTER — Ambulatory Visit: Payer: 59 | Admitting: Internal Medicine

## 2022-01-08 VITALS — BP 128/80 | HR 86 | Ht 67.5 in | Wt 234.0 lb

## 2022-01-08 DIAGNOSIS — I10 Essential (primary) hypertension: Secondary | ICD-10-CM

## 2022-01-08 DIAGNOSIS — K760 Fatty (change of) liver, not elsewhere classified: Secondary | ICD-10-CM | POA: Diagnosis not present

## 2022-01-08 DIAGNOSIS — D693 Immune thrombocytopenic purpura: Secondary | ICD-10-CM

## 2022-01-08 NOTE — Progress Notes (Signed)
? ? ?Date:  01/08/2022  ? ?Name:  Darlene Munoz   DOB:  [birth year]   MRN:  [id number] ? ? ?Chief Complaint: No chief complaint on file. ? ?Hypertension ?This is a chronic problem. The current episode started more than 1 year ago. The problem is unchanged. The problem is controlled. Pertinent negatives include no chest pain, headaches, palpitations or shortness of breath. Past treatments include diuretics, beta blockers and ACE inhibitors. There is no history of kidney disease, CAD/MI or CVA.  ?Hepatic adenomas - being followed by Surgery.  Recent MRI showed the lesions to be slightly smaller.  She is off of OCPs and doing well - irregular but no heavy bleeding or cramping. ? ?Lab Results  ?Component Value Date  ? NA 137 06/28/2021  ? K 4.3 06/28/2021  ? CO2 23 06/28/2021  ? GLUCOSE 94 06/28/2021  ? BUN 13 06/28/2021  ? CREATININE 0.94 06/28/2021  ? CALCIUM 9.7 06/28/2021  ? EGFR 80 06/28/2021  ? GFRNONAA 79 06/25/2020  ? ?Lab Results  ?Component Value Date  ? CHOL 248 (H) 06/28/2021  ? HDL 46 06/28/2021  ? LDLCALC 169 (H) 06/28/2021  ? TRIG 180 (H) 06/28/2021  ? CHOLHDL 5.4 (H) 06/28/2021  ? ?Lab Results  ?Component Value Date  ? TSH 3.020 06/28/2021  ? ?Lab Results  ?Component Value Date  ? HGBA1C 5.5 06/28/2021  ? ?Lab Results  ?Component Value Date  ? WBC 7.1 06/28/2021  ? HGB 14.6 06/28/2021  ? HCT 43.9 06/28/2021  ? MCV 90 06/28/2021  ? PLT 305 06/28/2021  ? ?Lab Results  ?Component Value Date  ? ALT 126 (H) 06/28/2021  ? AST 98 (H) 06/28/2021  ? ALKPHOS 109 06/28/2021  ? BILITOT 0.3 06/28/2021  ? ?No results found for: 25OHVITD2, [place], VD25OH  ? ?Review of Systems  ?Constitutional:  Negative for fatigue and unexpected weight change.  ?HENT:  Negative for nosebleeds.   ?Eyes:  Negative for visual disturbance.  ?Respiratory:  Negative for cough, chest tightness, shortness of breath and wheezing.   ?Cardiovascular:  Negative for chest pain, palpitations and leg swelling.  ?Gastrointestinal:  Negative for  abdominal pain, constipation and diarrhea.  ?Genitourinary:  Negative for menstrual problem.  ?Neurological:  Negative for dizziness, weakness, light-headedness and headaches.  ?Psychiatric/Behavioral:  Negative for dysphoric mood and sleep disturbance. The patient is not nervous/anxious.   ? ?Patient Active Problem List  ? Diagnosis Date Noted  ? Liver masses 08/06/2021  ? Hepatic steatosis 08/06/2021  ? Migraine with aura and without status migrainosus, not intractable 06/25/2020  ? Parasomnia 07/29/2017  ? Benign hypertension 09/30/2015  ? 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 Family planning 09/30/2015  ? Elevated LFTs 09/30/2015  ? Hyperlipidemia, mixed 09/30/2015  ? ? ?No Known Allergies ? ?History reviewed. No pertinent surgical history. ? ?Social History  ? ?Tobacco Use  ? Smoking status: Never  ? Smokeless tobacco: Never  ?Vaping Use  ? Vaping Use: Never used  ?Substance Use Topics  ? Alcohol use: No  ?  Alcohol/week: 0.0 standard drinks  ? ? ? ?Medication list has been reviewed and updated. ? ?Current Meds  ?Medication Sig  ? cetirizine (ZYRTEC) 10 MG tablet Take 10 mg by mouth as needed.   ? hydrochlorothiazide (HYDRODIURIL) 25 MG tablet TAKE 1 TABLET (25 MG TOTAL) BY MOUTH DAILY.  ? lisinopril (ZESTRIL) 20 MG tablet TAKE 1 TABLET (20 MG TOTAL) BY MOUTH DAILY.  ? metoprolol succinate (TOPROL-XL) 25 MG 24 hr tablet TAKE 1  TABLET (25 MG TOTAL) BY MOUTH DAILY.  ? ? ?PHQ 2/9 Scores 01/08/2022 06/28/2021 12/26/2020 06/25/2020  ?PHQ - 2 Score 0 0 0 0  ?PHQ- 9 Score 0 0 0 0  ? ? ?GAD 7 : Generalized Anxiety Score 01/08/2022 06/28/2021 12/26/2020 06/25/2020  ?Nervous, Anxious, on Edge 0 0 0 0  ?Control/stop worrying 0 0 0 0  ?Worry too much - different things 0 0 0 0  ?Trouble relaxing 0 0 0 0  ?Restless 0 0 0 0  ?Easily annoyed or irritable 0 0 0 0  ?Afraid - awful might happen 0 0 0 0  ?Total GAD 7 Score 0 0 0 0  ?Anxiety Difficulty Not difficult at all - Not difficult at all Not difficult at all  ? ? ?BP Readings  from Last 3 Encounters:  ?01/08/22 128/80  ?06/28/21 132/80  ?12/26/20 128/80  ? ? ?Physical Exam ?Vitals and nursing note reviewed.  ?Constitutional:   ?   General: She is not in acute distress. ?   Appearance: Normal appearance. She is well-developed.  ?HENT:  ?   Head: Normocephalic and atraumatic.  ?Cardiovascular:  ?   Rate and Rhythm: Normal rate and regular rhythm.  ?   Pulses: Normal pulses.  ?   Heart sounds: No murmur heard. ?Pulmonary:  ?   Effort: Pulmonary effort is normal. No respiratory distress.  ?   Breath sounds: No wheezing or rhonchi.  ?Musculoskeletal:  ?   Cervical back: Normal range of motion.  ?   Right lower leg: No edema.  ?   Left lower leg: No edema.  ?Lymphadenopathy:  ?   Cervical: No cervical adenopathy.  ?Skin: ?   General: Skin is warm and dry.  ?   Findings: No rash.  ?Neurological:  ?   Mental Status: She is alert and oriented to person, place, and time.  ?Psychiatric:     ?   Mood and Affect: Mood normal.     ?   Behavior: Behavior normal.  ? ? ?Wt Readings from Last 3 Encounters:  ?01/08/22 234 lb (106.1 kg)  ?07/31/21 237 lb (107.5 kg)  ?06/28/21 237 lb (107.5 kg)  ? ? ?BP 128/80   Pulse 86   Ht 5' 7.5" (1.715 m)   Wt 234 lb (106.1 kg)   SpO2 100%   BMI 36.11 kg/m?  ? ?Assessment and Plan: ?1. Benign hypertension ?Clinically stable exam with well controlled BP. ?Tolerating medications without side effects at this time. ?Pt to continue current regimen and low sodium diet; benefits of regular exercise as able discussed. ? ?2. Hepatic steatosis ?With hepatic adenomas on MRI - being followed by surgery ?Continue dietary changes for weight loss - consider Wegovy if unable to progress ?Will check LFTs ?- Comprehensive metabolic panel ? ?3. Immune thrombocytopenia (HCC) ?Stable over the past few years; no bleeding issues ?- CBC with Differential/Platelet ? ? ?Partially dictated using Editor, commissioning. Any errors are unintentional. ? ?Halina Maidens, MD ?[facility] ?[facility] ? ?01/08/2022 ? ? ? ? ?

## 2022-01-09 LAB — CBC WITH DIFFERENTIAL/PLATELET
Basophils Absolute: 0.1 10*3/uL (ref 0.0–0.2)
Basos: 1 %
EOS (ABSOLUTE): 0.1 10*3/uL (ref 0.0–0.4)
Eos: 1 %
Hematocrit: 43.4 % (ref 34.0–46.6)
Hemoglobin: 14.5 g/dL (ref 11.1–15.9)
Immature Grans (Abs): 0 10*3/uL (ref 0.0–0.1)
Immature Granulocytes: 0 %
Lymphocytes Absolute: 2.5 10*3/uL (ref 0.7–3.1)
Lymphs: 28 %
MCH: 29.5 pg (ref 26.6–33.0)
MCHC: 33.4 g/dL (ref 31.5–35.7)
MCV: 88 fL (ref 79–97)
Monocytes Absolute: 0.6 10*3/uL (ref 0.1–0.9)
Monocytes: 7 %
Neutrophils Absolute: 5.4 10*3/uL (ref 1.4–7.0)
Neutrophils: 63 %
Platelets: 308 10*3/uL (ref 150–450)
RBC: 4.91 x10E6/uL (ref 3.77–5.28)
RDW: 12.6 % (ref 11.7–15.4)
WBC: 8.6 10*3/uL (ref 3.4–10.8)

## 2022-01-09 LAB — COMPREHENSIVE METABOLIC PANEL
ALT: 81 IU/L — ABNORMAL HIGH (ref 0–32)
AST: 53 IU/L — ABNORMAL HIGH (ref 0–40)
Albumin/Globulin Ratio: 2 (ref 1.2–2.2)
Albumin: 4.7 g/dL (ref 3.8–4.8)
Alkaline Phosphatase: 111 IU/L (ref 44–121)
BUN/Creatinine Ratio: 14 (ref 9–23)
BUN: 12 mg/dL (ref 6–20)
Bilirubin Total: 0.3 mg/dL (ref 0.0–1.2)
CO2: 25 mmol/L (ref 20–29)
Calcium: 10 mg/dL (ref 8.7–10.2)
Chloride: 99 mmol/L (ref 96–106)
Creatinine, Ser: 0.86 mg/dL (ref 0.57–1.00)
Globulin, Total: 2.3 g/dL (ref 1.5–4.5)
Glucose: 83 mg/dL (ref 70–99)
Potassium: 4.3 mmol/L (ref 3.5–5.2)
Sodium: 138 mmol/L (ref 134–144)
Total Protein: 7 g/dL (ref 6.0–8.5)
eGFR: 89 mL/min/{1.73_m2} (ref >59)

## 2022-01-15 DIAGNOSIS — H52223 Regular astigmatism, bilateral: Secondary | ICD-10-CM | POA: Diagnosis not present

## 2022-01-15 DIAGNOSIS — H5213 Myopia, bilateral: Secondary | ICD-10-CM | POA: Diagnosis not present

## 2022-05-01 IMAGING — MR MR ABDOMEN WO/W CM
16 series · 46 of 48 positions shown · IV contrast (EOVIST 10)
Comparison: MRI abdomen 07/31/2021

CLINICAL DATA: Liver lesion follow-up

EXAM:
MRI ABDOMEN WITHOUT AND WITH CONTRAST
TECHNIQUE: Multiplanar multisequence MR imaging of the abdomen was performed
both before and after the administration of intravenous contrast.
CONTRAST:  10mL EOVIST GADOXETATE DISODIUM 0.25 MOL/L IV SOLN

[Series 3: T2 · coronal · 5.0mm · 1.56mm/px · 2 of 36 slices shown (1 of 3)]
[im 1/36]
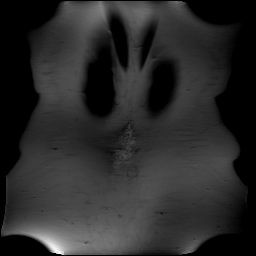
[im 36/36]
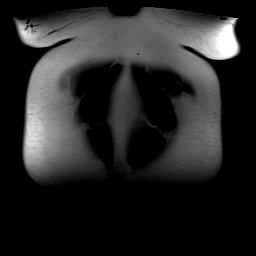

[Series 4: ax in out · axial · 3.5mm · 0.74mm/px · z∈[-146,+71]mm · 6 of 110 slices shown]
[im 1/110]
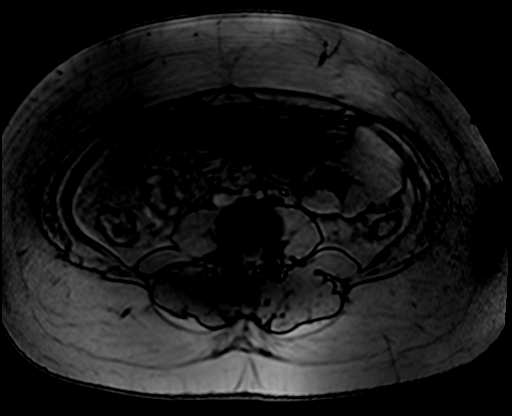
[im 22/110]
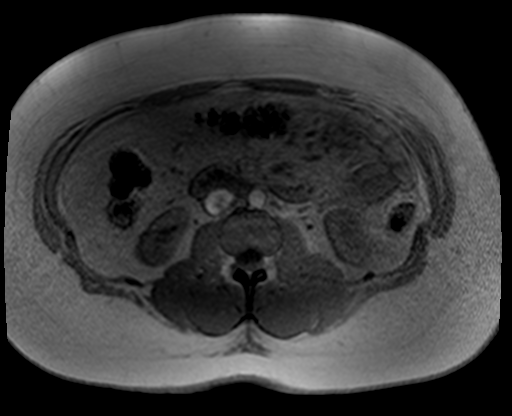
[im 44/110]
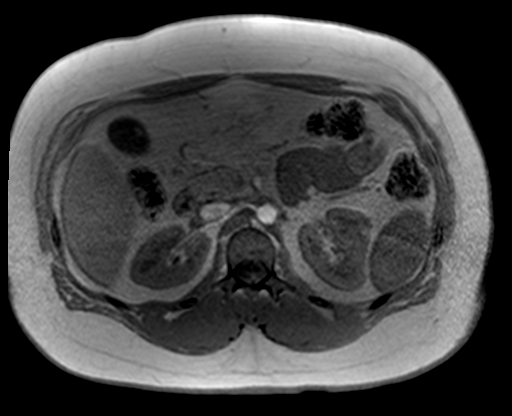
[im 66/110]
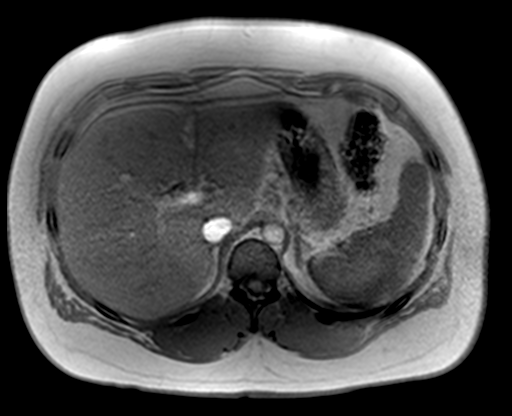
[im 88/110]
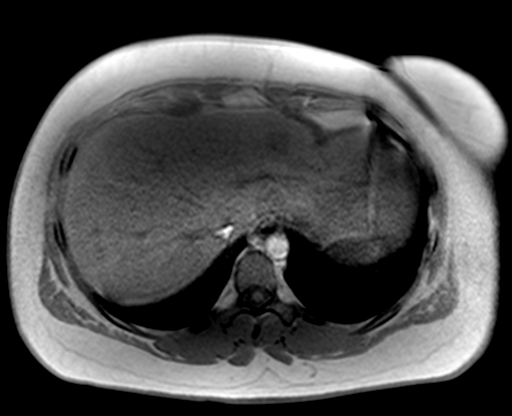
[im 110/110]
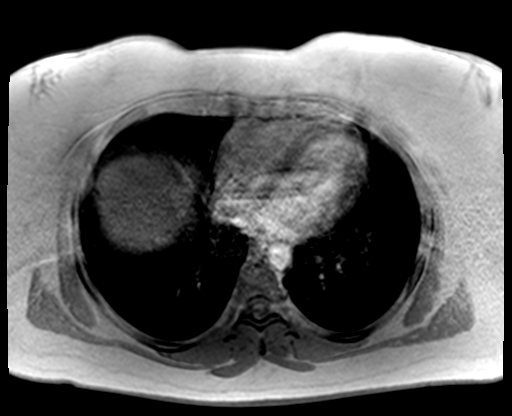

[Series 5: T1 dynamic · axial · non-contrast · 3.0mm · 1.19mm/px · z∈[-158,+79]mm · 3 of 80 slices shown]
[im 1/80]
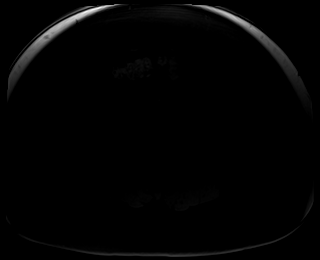
[im 40/80]
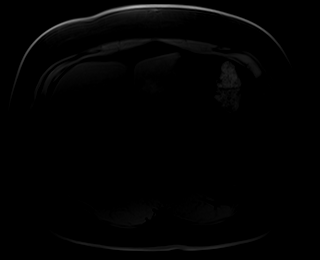
[im 80/80]
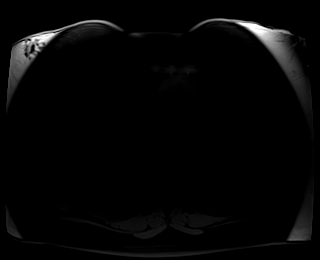

[Series 6: T1 dynamic post-contrast · axial · 3.0mm · 1.19mm/px · z∈[-158,+79]mm · 3 of 80 slices shown (1 of 9)]
[im 1/80]
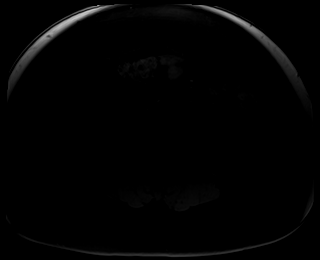
[im 40/80]
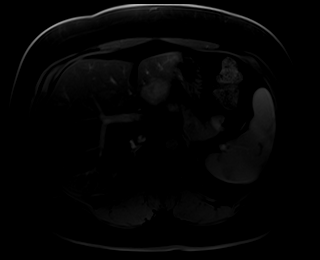
[im 80/80]
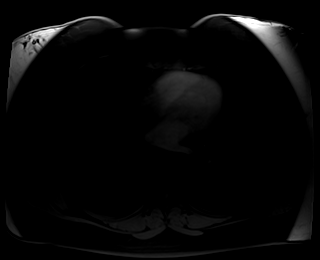

[Series 7: T1 dynamic post-contrast · axial · 3.0mm · 1.19mm/px · z∈[-158,+79]mm · 3 of 80 slices shown (2 of 9)]
[im 1/80]
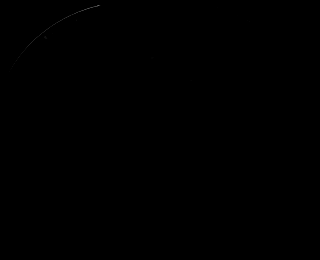
[im 40/80]
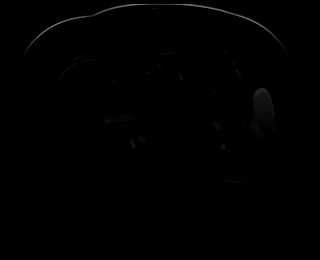
[im 80/80]
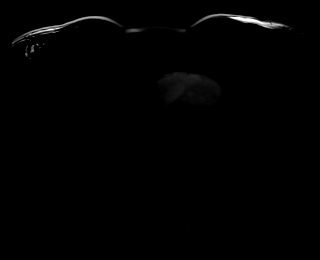

[Series 8: T1 dynamic post-contrast · axial · 3.0mm · 1.19mm/px · z∈[-158,+79]mm · 3 of 80 slices shown (3 of 9)]
[im 1/80]
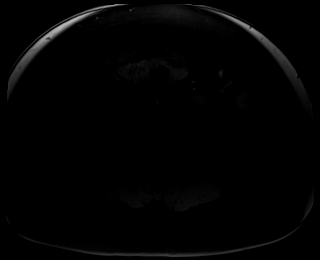
[im 40/80]
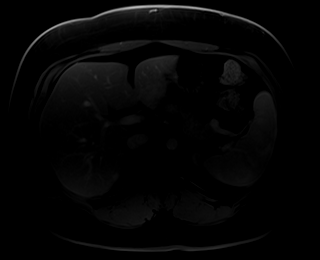
[im 80/80]
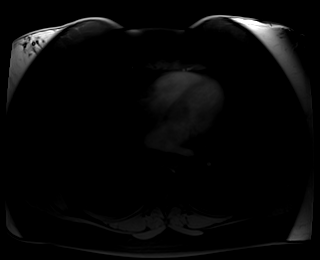

[Series 9: T1 dynamic post-contrast · axial · 3.0mm · 1.19mm/px · z∈[-158,+79]mm · 3 of 80 slices shown (4 of 9)]
[im 1/80]
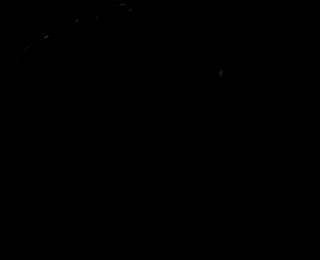
[im 40/80]
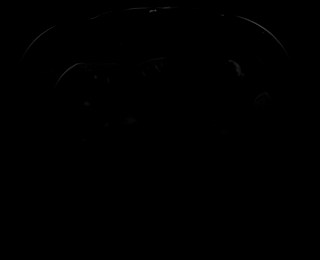
[im 80/80]
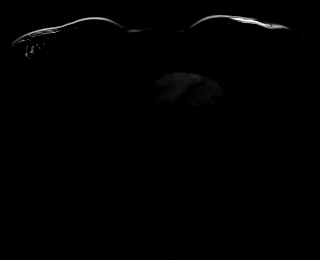

[Series 10: T1 dynamic post-contrast · axial · 3.0mm · 1.19mm/px · z∈[-158,+79]mm · 3 of 80 slices shown (5 of 9)]
[im 1/80]
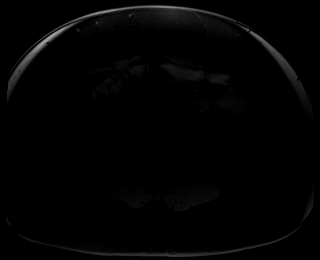
[im 40/80]
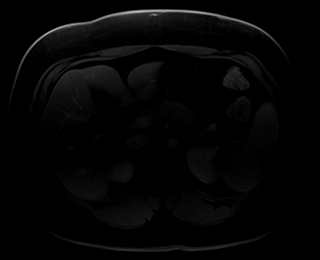
[im 80/80]
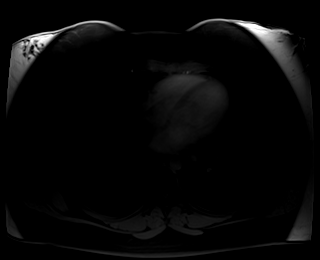

[Series 11: T1 dynamic post-contrast · axial · 3.0mm · 1.19mm/px · z∈[-158,+79]mm · 3 of 80 slices shown (6 of 9)]
[im 1/80]
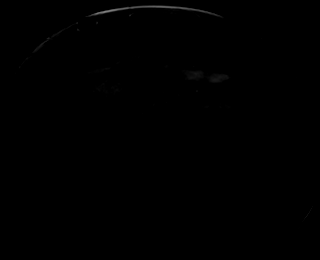
[im 40/80]
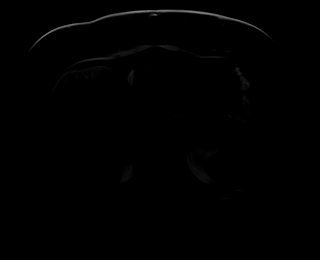
[im 80/80]
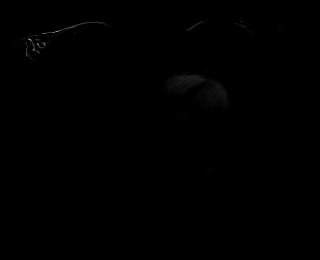

[Series 13: T1 dynamic post-contrast · coronal · 3.0mm · 1.25mm/px · 3 of 72 slices shown (7 of 9)]
[im 1/72]
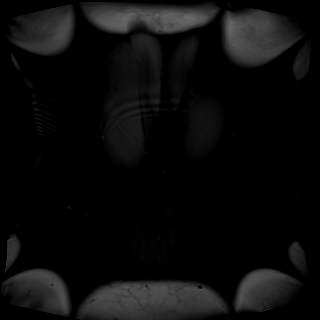
[im 36/72]
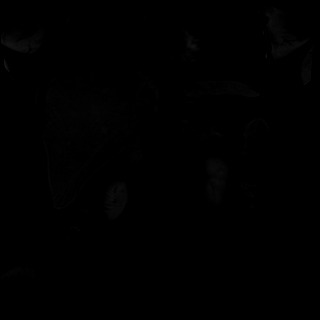
[im 72/72]
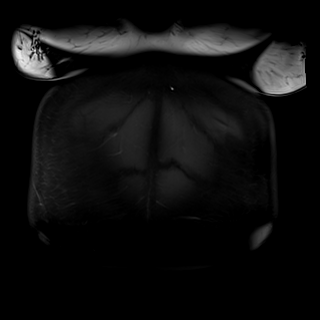

[Series 14: T2 · axial · 5.0mm · 1.48mm/px · z∈[-122,+118]mm · 2 of 41 slices shown (2 of 3)]
[im 1/41]
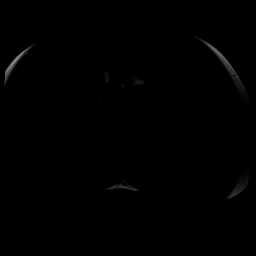
[im 41/41]
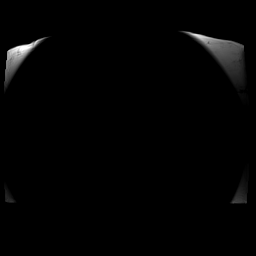

[Series 15: T2 · axial · 6.0mm · 1.22mm/px · 1 of 33 slices shown (3 of 3)]
[im 1/33]
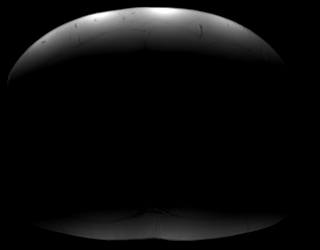

[Series 16: DWI · axial · 5.0mm · 1.42mm/px · z∈[-127,+107]mm · 5 of 120 slices shown (1 of 2)]
[im 1/120]
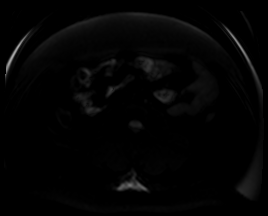
[im 30/120]
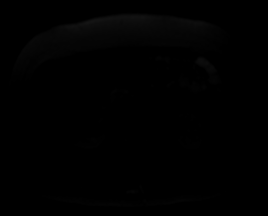
[im 60/120]
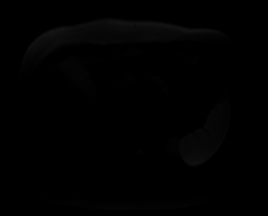
[im 90/120]
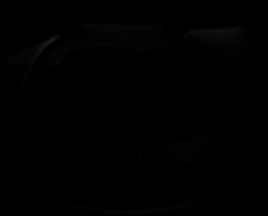
[im 120/120]
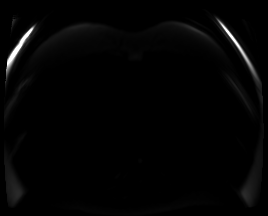

[Series 17: DWI · axial · 5.0mm · 1.42mm/px · z∈[-127,+107]mm · 2 of 40 slices shown (2 of 2)]
[im 1/40]
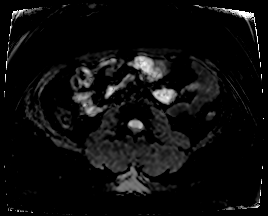
[im 40/40]
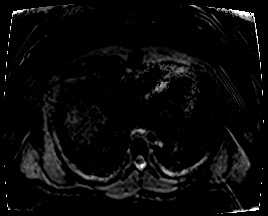

[Series 18: T1 dynamic post-contrast · axial · 3.0mm · 1.19mm/px · z∈[-158,+79]mm · 3 of 80 slices shown (8 of 9)]
[im 1/80]
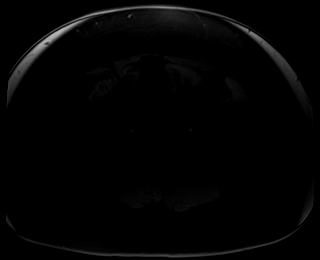
[im 40/80]
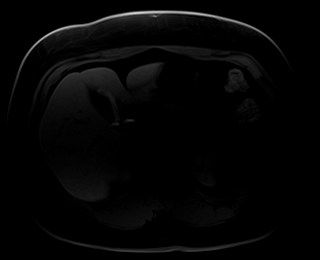
[im 80/80]
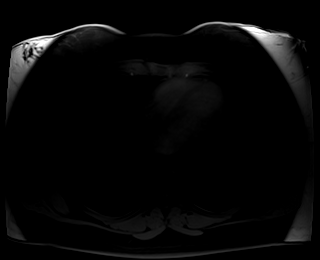

[Series 19: T1 dynamic post-contrast · axial · 3.0mm · 1.19mm/px · 1 of 80 slices shown (9 of 9)]
[im 1/80]
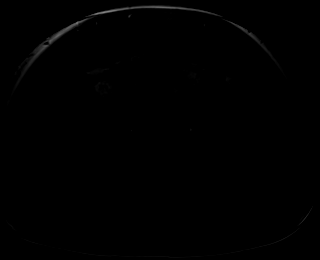

[46 of 48 positions shown; findings below may reference images not displayed]

FINDINGS: Lower chest: No acute findings.

Hepatobiliary: Liver is enlarged measuring 19.4 cm in length.
Evidence of marked hepatic steatosis. Redemonstration of numerous
(approximately 20) mildly hyperintense T1 and T2 signal lesions
throughout the liver. The lesions are all either stable or mildly
decreased in size since previous study including decreased size of
the largest mass in the left hepatic lobe segment 3 which is
subcapsular now measuring 4.3 x 3.1 cm, and largest right hepatic
lobe lesion subcapsular posteriorly in segment 7 measuring up to 2
cm. The lesions all demonstrate early arterial homogeneous
enhancement with relative washout of the lesions mostly by 90
seconds and almost all lesions are mildly hypointense compared to
liver parenchyma on the delayed hepatobiliary phase, which are most
consistent with hepatic adenomas. A least 1 lesion in the anterior
right hepatic lobe measuring 1.7 cm demonstrates mild persistent
enhancement on the hepatobiliary phase, which would be more
consistent with focal nodular hyperplasia. Gallbladder appears
normal. No biliary ductal dilatation identified.

Pancreas: No mass, inflammatory changes, or other parenchymal
abnormality identified.

Spleen:  Within normal limits in size and appearance.

Adrenals/Urinary Tract: No masses identified. No evidence of
hydronephrosis.

Stomach/Bowel: Visualized portions within the abdomen are
unremarkable.

Vascular/Lymphatic: No pathologically enlarged lymph nodes
identified. No abdominal aortic aneurysm demonstrated.

Other:  No ascites.

Musculoskeletal: No suspicious bone lesions identified.
IMPRESSION: 1. Hepatomegaly and marked hepatic steatosis.
2. Numerous enhancing hepatic lesions again seen which are all
either stable or mildly decreased in size since previous study.
Signal characteristics and enhancement pattern suggest most of the
lesions are hepatic adenomas. There is at least 1 small lesion in
the right hepatic lobe which demonstrates persistent mild
enhancement on the hepatobiliary phase which is more consistent with
FNH. Continued surveillance recommended, especially of the larger
subcapsular lesions.

## 2022-07-04 ENCOUNTER — Telehealth: Payer: Self-pay

## 2022-07-04 ENCOUNTER — Ambulatory Visit (INDEPENDENT_AMBULATORY_CARE_PROVIDER_SITE_OTHER): Payer: 59 | Admitting: Internal Medicine

## 2022-07-04 ENCOUNTER — Encounter: Payer: Self-pay | Admitting: Internal Medicine

## 2022-07-04 ENCOUNTER — Other Ambulatory Visit: Payer: 59

## 2022-07-04 ENCOUNTER — Other Ambulatory Visit: Payer: Self-pay

## 2022-07-04 VITALS — BP 128/76 | HR 106 | Ht 67.5 in | Wt 241.0 lb

## 2022-07-04 DIAGNOSIS — D134 Benign neoplasm of liver: Secondary | ICD-10-CM | POA: Diagnosis not present

## 2022-07-04 DIAGNOSIS — Z131 Encounter for screening for diabetes mellitus: Secondary | ICD-10-CM | POA: Diagnosis not present

## 2022-07-04 DIAGNOSIS — I10 Essential (primary) hypertension: Secondary | ICD-10-CM

## 2022-07-04 DIAGNOSIS — E782 Mixed hyperlipidemia: Secondary | ICD-10-CM | POA: Diagnosis not present

## 2022-07-04 DIAGNOSIS — Z0000 Encounter for general adult medical examination without abnormal findings: Secondary | ICD-10-CM | POA: Diagnosis not present

## 2022-07-04 DIAGNOSIS — Z6837 Body mass index (BMI) 37.0-37.9, adult: Secondary | ICD-10-CM | POA: Diagnosis not present

## 2022-07-04 MED ORDER — WEGOVY 0.25 MG/0.5ML ~~LOC~~ SOAJ
0.2500 mg | SUBCUTANEOUS | 0 refills | Status: DC
  Filled 2022-07-04 – 2022-07-08 (×2): qty 2, 28d supply, fill #0

## 2022-07-04 NOTE — Telephone Encounter (Signed)
PA completed waiting on insurance to approve.  Key: [id number]  KP

## 2022-07-04 NOTE — Progress Notes (Signed)
Date:  07/04/2022   Name:  Darlene Munoz   DOB:  Aug 12, 1981   MRN:  [id number]   Chief Complaint: Annual Exam (Breast exam no pap) Darlene Munoz is a 41 y.o. female who presents today for her Complete Annual Exam. She feels well. She reports exercising. She reports she is sleeping well. Breast complaints - none. She is off OCPs and doing okay.  Menses are slightly irregular. She is still struggling with weight and would like to try [facility].  Mammogram: none DEXA: none Pap smear: 02/2018 Colonoscopy: none  Health Maintenance Due  Topic Date Due   INFLUENZA VACCINE  06/10/2022    Immunization History  Administered Date(s) Administered   Influenza,inj,Quad PF,6+ Mos 08/03/2020   Influenza-Unspecified 08/06/2018, 08/05/2019, 08/16/2021   Moderna Sars-Covid-2 Vaccination 07/14/2021   PFIZER(Purple Top)SARS-COV-2 Vaccination 11/22/2019, 12/13/2019, 08/24/2020   Tdap 01/14/2008, 02/18/2018    Hypertension This is a chronic problem. The problem is controlled. Pertinent negatives include no chest pain, headaches, palpitations or shortness of breath. Past treatments include ACE inhibitors, diuretics and beta blockers. The current treatment provides significant improvement.  Hepatic adenoma - seeing GI specialist in [place] with plans for repeat MRI done there.  Lab Results  Component Value Date   NA 138 01/08/2022   K 4.3 01/08/2022   CO2 25 01/08/2022   GLUCOSE 83 01/08/2022   BUN 12 01/08/2022   CREATININE 0.86 01/08/2022   CALCIUM 10.0 01/08/2022   EGFR 89 01/08/2022   GFRNONAA 79 06/25/2020   Lab Results  Component Value Date   CHOL 248 (H) 06/28/2021   HDL 46 06/28/2021   LDLCALC 169 (H) 06/28/2021   TRIG 180 (H) 06/28/2021   CHOLHDL 5.4 (H) 06/28/2021   Lab Results  Component Value Date   TSH 3.020 06/28/2021   Lab Results  Component Value Date   HGBA1C 5.5 06/28/2021   Lab Results  Component Value Date   WBC 8.6 01/08/2022   HGB 14.5 01/08/2022   HCT  43.4 01/08/2022   MCV 88 01/08/2022   PLT 308 01/08/2022   Lab Results  Component Value Date   ALT 81 (H) 01/08/2022   AST 53 (H) 01/08/2022   ALKPHOS 111 01/08/2022   BILITOT 0.3 01/08/2022   No results found for: "25OHVITD2", "25OHVITD3", "VD25OH"   Review of Systems  Constitutional:  Negative for chills, fatigue and fever.  HENT:  Negative for congestion, hearing loss, tinnitus, trouble swallowing and voice change.   Eyes:  Negative for visual disturbance.  Respiratory:  Negative for cough, chest tightness, shortness of breath and wheezing.   Cardiovascular:  Negative for chest pain, palpitations and leg swelling.  Gastrointestinal:  Negative for abdominal pain, constipation, diarrhea and vomiting.  Endocrine: Negative for polydipsia and polyuria.  Genitourinary:  Negative for dysuria, frequency, genital sores, vaginal bleeding and vaginal discharge.  Musculoskeletal:  Negative for arthralgias, gait problem and joint swelling.  Skin:  Negative for color change and rash.  Neurological:  Negative for dizziness, tremors, light-headedness and headaches.  Hematological:  Negative for adenopathy. Does not bruise/bleed easily.  Psychiatric/Behavioral:  Negative for dysphoric mood and sleep disturbance. The patient is not nervous/anxious.     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 No pertinent surgical history.  Social History   Tobacco Use   Smoking status: Never   Smokeless tobacco: Never  Vaping Use   Vaping Use: Never used  Substance Use Topics   Alcohol use: No    Alcohol/week: 0.0  standard drinks of alcohol     Medication list has been reviewed and updated.  Current Meds  Medication Sig   cetirizine (ZYRTEC) 10 MG tablet Take 10 mg by mouth as needed.    hydrochlorothiazide (HYDRODIURIL) 25 MG tablet TAKE 1 TABLET (25 MG TOTAL) BY MOUTH DAILY.   lisinopril (ZESTRIL) 20 MG tablet TAKE 1 TABLET (20 MG TOTAL) BY MOUTH DAILY.   metoprolol succinate (TOPROL-XL) 25 MG 24 hr tablet TAKE 1 TABLET (25 MG TOTAL) BY MOUTH DAILY.       07/04/2022    8:00 AM 01/08/2022    1:23 PM 06/28/2021    7:58 AM 12/26/2020    1:22 PM  GAD 7 : Generalized Anxiety Score  Nervous, Anxious, on Edge 0 0 0 0  Control/stop worrying 0 0 0 0  Worry too much - different things 0 0 0 0  Trouble relaxing 0 0 0 0  Restless 0 0 0 0  Easily annoyed or irritable 0 0 0 0  Afraid - awful might happen 0 0 0 0  Total GAD 7 Score 0 0 0 0  Anxiety Difficulty Not difficult at all Not difficult at all  Not difficult at all       07/04/2022    8:00 AM 01/08/2022    1:23 PM 06/28/2021    7:58 AM  Depression screen PHQ 2/9  Decreased Interest 0 0 0  Down, Depressed, Hopeless 0 0 0  PHQ - 2 Score 0 0 0  Altered sleeping 0 0 0  Tired, decreased energy 0 0 0  Change in appetite 0 0 0  Feeling bad or failure about yourself  0 0 0  Trouble concentrating 0 0 0  Moving slowly or fidgety/restless 0 0 0  Suicidal thoughts 0 0 0  PHQ-9 Score 0 0 0  Difficult doing work/chores Not difficult at all Not difficult at all Not difficult at all    BP Readings from Last 3 Encounters:  07/04/22 128/76  01/08/22 128/80  06/28/21 132/80    Physical Exam Vitals and nursing note reviewed.  Constitutional:      General: She is not in acute distress.    Appearance: She is well-developed.  HENT:     Head: Normocephalic and atraumatic.     Right Ear: Tympanic membrane and ear canal normal.     Left Ear: Tympanic membrane and ear canal normal.     Nose:     Right Sinus: No maxillary sinus tenderness.     Left  Sinus: No maxillary sinus tenderness.  Eyes:     General: No scleral icterus.       Right eye: No discharge.        Left eye: No discharge.     Conjunctiva/sclera: Conjunctivae normal.  Neck:     Thyroid: No thyromegaly.     Vascular: No carotid bruit.  Cardiovascular:     Rate and Rhythm: Normal rate and regular rhythm.     Pulses: Normal pulses.     Heart sounds: Normal heart sounds.  Pulmonary:     Effort: Pulmonary effort is normal. No respiratory  distress.     Breath sounds: No wheezing.  Chest:  Breasts:    Right: No mass, nipple discharge, skin change or tenderness.     Left: No mass, nipple discharge, skin change or tenderness.  Abdominal:     General: Bowel sounds are normal.     Palpations: Abdomen is soft.     Tenderness: There is no abdominal tenderness.  Musculoskeletal:     Cervical back: Normal range of motion. No erythema.     Right lower leg: No edema.     Left lower leg: No edema.  Lymphadenopathy:     Cervical: No cervical adenopathy.  Skin:    General: Skin is warm and dry.     Findings: No rash.  Neurological:     Mental Status: She is alert and oriented to person, place, and time.     Cranial Nerves: No cranial nerve deficit.     Sensory: No sensory deficit.     Deep Tendon Reflexes: Reflexes are normal and symmetric.  Psychiatric:        Attention and Perception: Attention normal.        Mood and Affect: Mood normal.     Wt Readings from Last 3 Encounters:  07/04/22 241 lb (109.3 kg)  01/08/22 234 lb (106.1 kg)  07/31/21 237 lb (107.5 kg)    BP 128/76   Pulse (!) 106   Ht 5' 7.5" (1.715 m)   Wt 241 lb (109.3 kg)   LMP 06/09/2022 (Approximate)   SpO2 97%   BMI 37.19 kg/m   Assessment and Plan: 1. Annual physical exam Exam is normal except for weight. Encourage regular exercise and appropriate dietary changes. Will start Wegovy. She will get Flu vaccine and next Covid booster through work.  2. Benign hypertension Clinically stable  exam with well controlled BP. Tolerating medications without side effects at this time. Pt to continue current regimen and low sodium diet; benefits of regular exercise as able discussed. - CBC with Differential/Platelet - TSH  3. Hyperlipidemia, mixed Continue diet changes - Lipid panel  4. Adenoma of liver Followed by GI - Comprehensive metabolic panel  5. Screening for diabetes mellitus - Hemoglobin A1c  6. BMI 37.0-37.9, adult Begin Wegovy 0.25 mg weekly x 4 weeks then increase. Follow up in 3 months. - Semaglutide-Weight Management (WEGOVY) 0.25 MG/0.5ML SOAJ; Inject 0.25 mg into the skin once a week.  Dispense: 2 mL; Refill: 0   Partially dictated using Editor, commissioning. Any errors are unintentional.  Halina Maidens, MD [facility]  07/04/2022

## 2022-07-04 NOTE — Progress Notes (Unsigned)
Labs ordered.

## 2022-07-05 LAB — COMPREHENSIVE METABOLIC PANEL
ALT: 96 IU/L — ABNORMAL HIGH (ref 0–32)
AST: 66 IU/L — ABNORMAL HIGH (ref 0–40)
Albumin/Globulin Ratio: 1.9 (ref 1.2–2.2)
Albumin: 4.7 g/dL (ref 3.9–4.9)
Alkaline Phosphatase: 108 IU/L (ref 44–121)
BUN/Creatinine Ratio: 14 (ref 9–23)
BUN: 13 mg/dL (ref 6–20)
Bilirubin Total: 0.5 mg/dL (ref 0.0–1.2)
CO2: 24 mmol/L (ref 20–29)
Calcium: 9.3 mg/dL (ref 8.7–10.2)
Chloride: 96 mmol/L (ref 96–106)
Creatinine, Ser: 0.92 mg/dL (ref 0.57–1.00)
Globulin, Total: 2.5 g/dL (ref 1.5–4.5)
Glucose: 77 mg/dL (ref 70–99)
Potassium: 3.6 mmol/L (ref 3.5–5.2)
Sodium: 138 mmol/L (ref 134–144)
Total Protein: 7.2 g/dL (ref 6.0–8.5)
eGFR: 82 mL/min/{1.73_m2} (ref >59)

## 2022-07-05 LAB — CBC WITH DIFFERENTIAL/PLATELET
Basophils Absolute: 0.1 10*3/uL (ref 0.0–0.2)
Basos: 1 %
EOS (ABSOLUTE): 0.1 10*3/uL (ref 0.0–0.4)
Eos: 1 %
Hematocrit: 42.4 % (ref 34.0–46.6)
Hemoglobin: 14.5 g/dL (ref 11.1–15.9)
Immature Grans (Abs): 0 10*3/uL (ref 0.0–0.1)
Immature Granulocytes: 0 %
Lymphocytes Absolute: 2.7 10*3/uL (ref 0.7–3.1)
Lymphs: 31 %
MCH: 30 pg (ref 26.6–33.0)
MCHC: 34.2 g/dL (ref 31.5–35.7)
MCV: 88 fL (ref 79–97)
Monocytes Absolute: 0.5 10*3/uL (ref 0.1–0.9)
Monocytes: 6 %
Neutrophils Absolute: 5.4 10*3/uL (ref 1.4–7.0)
Neutrophils: 61 %
Platelets: 127 10*3/uL — ABNORMAL LOW (ref 150–450)
RBC: 4.83 x10E6/uL (ref 3.77–5.28)
RDW: 12.5 % (ref 11.7–15.4)
WBC: 8.9 10*3/uL (ref 3.4–10.8)

## 2022-07-05 LAB — LIPID PANEL
Chol/HDL Ratio: 4.8 ratio — ABNORMAL HIGH (ref 0.0–4.4)
Cholesterol, Total: 227 mg/dL — ABNORMAL HIGH (ref 100–199)
HDL: 47 mg/dL (ref >39)
LDL Chol Calc (NIH): 155 mg/dL — ABNORMAL HIGH (ref 0–99)
Triglycerides: 136 mg/dL (ref 0–149)
VLDL Cholesterol Cal: 25 mg/dL (ref 5–40)

## 2022-07-05 LAB — HEMOGLOBIN A1C
Est. average glucose Bld gHb Est-mCnc: 111 mg/dL
Hgb A1c MFr Bld: 5.5 % (ref 4.8–5.6)

## 2022-07-05 LAB — TSH: TSH: 3.62 u[IU]/mL (ref 0.450–4.500)

## 2022-07-07 NOTE — Telephone Encounter (Signed)
Approved 07/07/2022-01/26/2023  KP  Pt informed via Mychart.  KP

## 2022-07-07 NOTE — Progress Notes (Signed)
HI Meg. Your lab work shows that your A1c is within normal range.  Cholesterol and liver enzymes are mildly elevated.  No other concerns at this time.  Follow up as discussed with your PCP.

## 2022-07-08 ENCOUNTER — Other Ambulatory Visit: Payer: Self-pay

## 2022-07-28 ENCOUNTER — Other Ambulatory Visit: Payer: Self-pay | Admitting: Internal Medicine

## 2022-07-28 ENCOUNTER — Other Ambulatory Visit: Payer: Self-pay

## 2022-07-28 ENCOUNTER — Encounter: Payer: Self-pay | Admitting: Internal Medicine

## 2022-07-28 DIAGNOSIS — Z6837 Body mass index (BMI) 37.0-37.9, adult: Secondary | ICD-10-CM

## 2022-07-28 MED ORDER — WEGOVY 0.5 MG/0.5ML ~~LOC~~ SOAJ
0.5000 mg | SUBCUTANEOUS | 0 refills | Status: DC
  Filled 2022-07-28: qty 2, fill #0

## 2022-07-30 ENCOUNTER — Encounter: Payer: Self-pay | Admitting: Internal Medicine

## 2022-07-30 ENCOUNTER — Other Ambulatory Visit: Payer: Self-pay | Admitting: Internal Medicine

## 2022-07-30 ENCOUNTER — Other Ambulatory Visit: Payer: Self-pay

## 2022-07-30 DIAGNOSIS — Z6837 Body mass index (BMI) 37.0-37.9, adult: Secondary | ICD-10-CM

## 2022-07-30 MED ORDER — WEGOVY 1 MG/0.5ML ~~LOC~~ SOAJ
1.0000 mg | SUBCUTANEOUS | 1 refills | Status: DC
  Filled 2022-07-30: qty 2, 28d supply, fill #0
  Filled 2022-08-23: qty 2, 28d supply, fill #1
  Filled 2022-09-25: qty 2, 28d supply, fill #2
  Filled 2022-10-22: qty 2, 28d supply, fill #3
  Filled 2022-11-24: qty 2, 28d supply, fill #4
  Filled 2022-12-21: qty 2, 28d supply, fill #5

## 2022-08-15 ENCOUNTER — Encounter: Payer: Self-pay | Admitting: Internal Medicine

## 2022-08-25 ENCOUNTER — Other Ambulatory Visit: Payer: Self-pay

## 2022-08-28 ENCOUNTER — Other Ambulatory Visit: Payer: Self-pay

## 2022-09-25 ENCOUNTER — Other Ambulatory Visit: Payer: Self-pay

## 2022-10-08 ENCOUNTER — Encounter: Payer: Self-pay | Admitting: Internal Medicine

## 2022-10-08 ENCOUNTER — Ambulatory Visit: Payer: 59 | Admitting: Internal Medicine

## 2022-10-08 VITALS — BP 104/76 | HR 112 | Ht 67.5 in | Wt 216.0 lb

## 2022-10-08 DIAGNOSIS — Z6837 Body mass index (BMI) 37.0-37.9, adult: Secondary | ICD-10-CM

## 2022-10-08 DIAGNOSIS — I10 Essential (primary) hypertension: Secondary | ICD-10-CM

## 2022-10-08 NOTE — Progress Notes (Signed)
Date:  10/08/2022   Name:  Darlene Munoz   DOB:  Jul 24, 1981   MRN:  [id number]   Chief Complaint: Hypertension and Weight Check (Down 25 lbs)  Hypertension This is a chronic problem. The problem is controlled. Pertinent negatives include no chest pain, headaches or shortness of breath. Past treatments include diuretics, beta blockers and ACE inhibitors. The current treatment provides significant improvement. There is no history of kidney disease, CAD/MI or CVA.   Weight management - pt is here for weight management follow up.  Started on [place] on 06/24/22.  Doing well without side effects.  Starting weight 241 lbs.   Today's weight 216 lbs.  Current diet is low carb and current exercise routine is walking. Current Wegovy dose is 1 mg weekly.   Lab Results  Component Value Date   NA 138 07/04/2022   K 3.6 07/04/2022   CO2 24 07/04/2022   GLUCOSE 77 07/04/2022   BUN 13 07/04/2022   CREATININE 0.92 07/04/2022   CALCIUM 9.3 07/04/2022   EGFR 82 07/04/2022   GFRNONAA 79 06/25/2020   Lab Results  Component Value Date   CHOL 227 (H) 07/04/2022   HDL 47 07/04/2022   LDLCALC 155 (H) 07/04/2022   TRIG 136 07/04/2022   CHOLHDL 4.8 (H) 07/04/2022   Lab Results  Component Value Date   TSH 3.620 07/04/2022   Lab Results  Component Value Date   HGBA1C 5.5 07/04/2022   Lab Results  Component Value Date   WBC 8.9 07/04/2022   HGB 14.5 07/04/2022   HCT 42.4 07/04/2022   MCV 88 07/04/2022   PLT 127 (L) 07/04/2022   Lab Results  Component Value Date   ALT 96 (H) 07/04/2022   AST 66 (H) 07/04/2022   ALKPHOS 108 07/04/2022   BILITOT 0.5 07/04/2022   No results found for: "25OHVITD2", "25OHVITD3", "VD25OH"   Review of Systems  Constitutional:  Positive for appetite change. Negative for diaphoresis, fatigue and unexpected weight change.  Respiratory:  Negative for chest tightness and shortness of breath.   Cardiovascular:  Negative for chest pain.  Neurological:  Negative  for dizziness and headaches.    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 No pertinent surgical history.  Social History   Tobacco Use   Smoking status: Never   Smokeless tobacco: Never  Vaping Use   Vaping Use: Never used  Substance Use Topics   Alcohol use: No    Alcohol/week: 0.0 standard drinks of alcohol     Medication list has been reviewed and updated.  Current Meds  Medication Sig   cetirizine (ZYRTEC) 10 MG tablet Take 10 mg by mouth as needed.    hydrochlorothiazide (HYDRODIURIL) 25 MG tablet TAKE 1 TABLET (25 MG TOTAL) BY MOUTH DAILY.   lisinopril (ZESTRIL) 20 MG tablet TAKE 1 TABLET (20 MG TOTAL) BY MOUTH DAILY.   metoprolol succinate (TOPROL-XL) 25 MG 24 hr tablet TAKE 1 TABLET (25 MG TOTAL) BY MOUTH DAILY.   Semaglutide-Weight Management (WEGOVY) 1 MG/0.5ML SOAJ Inject 1 mg into the skin once a week.       10/08/2022  1:23 PM 07/04/2022    8:00 AM 01/08/2022    1:23 PM 06/28/2021    7:58 AM  GAD 7 : Generalized Anxiety Score  Nervous, Anxious, on Edge 0 0 0 0  Control/stop worrying 0 0 0 0  Worry too much - different things 0 0 0 0  Trouble relaxing 0 0 0 0  Restless 0 0 0 0  Easily annoyed or irritable 0 0 0 0  Afraid - awful might happen 0 0 0 0  Total GAD 7 Score 0 0 0 0  Anxiety Difficulty Not difficult at all Not difficult at all Not difficult at all        10/08/2022    1:23 PM 07/04/2022    8:00 AM 01/08/2022    1:23 PM  Depression screen PHQ 2/9  Decreased Interest 0 0 0  Down, Depressed, Hopeless 0 0 0  PHQ - 2 Score 0 0 0  Altered sleeping 0 0 0   Tired, decreased energy 0 0 0  Change in appetite 0 0 0  Feeling bad or failure about yourself  0 0 0  Trouble concentrating 0 0 0  Moving slowly or fidgety/restless 0 0 0  Suicidal thoughts 0 0 0  PHQ-9 Score 0 0 0  Difficult doing work/chores Not difficult at all Not difficult at all Not difficult at all    BP Readings from Last 3 Encounters:  10/08/22 104/76  07/04/22 128/76  01/08/22 128/80    Physical Exam Vitals and nursing note reviewed.  Constitutional:      General: She is not in acute distress.    Appearance: Normal appearance. She is well-developed.  HENT:     Head: Normocephalic and atraumatic.  Cardiovascular:     Rate and Rhythm: Normal rate and regular rhythm.  Pulmonary:     Effort: Pulmonary effort is normal. No respiratory distress.     Breath sounds: No wheezing or rhonchi.  Musculoskeletal:     Cervical back: Normal range of motion.     Right lower leg: No edema.     Left lower leg: No edema.  Skin:    General: Skin is warm and dry.     Findings: No rash.  Neurological:     General: No focal deficit present.     Mental Status: She is alert and oriented to person, place, and time.  Psychiatric:        Mood and Affect: Mood normal.        Behavior: Behavior normal.     Wt Readings from Last 3 Encounters:  10/08/22 216 lb (98 kg)  07/04/22 241 lb (109.3 kg)  01/08/22 234 lb (106.1 kg)    BP 104/76   Pulse (!) 112   Ht 5' 7.5" (1.715 m)   Wt 216 lb (98 kg)   SpO2 97%   BMI 33.33 kg/m   Assessment and Plan: 1. Benign hypertension Clinically stable exam with well controlled BP. Tolerating medications without side effects at this time. Pt to continue current regimen and low sodium diet; benefits of regular exercise as able discussed.  2. BMI 37.0-37.9, adult Excellent weight loss on Wegovy.  Current dose 1 mg. Will continue 1 mg weekly. Follow up in three months.   Partially dictated using Editor, commissioning. Any errors are  unintentional.  Halina Maidens, MD [facility]  10/08/2022

## 2022-10-20 ENCOUNTER — Ambulatory Visit: Payer: 59 | Admitting: Internal Medicine

## 2022-10-22 ENCOUNTER — Other Ambulatory Visit: Payer: Self-pay

## 2022-10-22 ENCOUNTER — Other Ambulatory Visit: Payer: Self-pay | Admitting: General Surgery

## 2022-10-22 DIAGNOSIS — R160 Hepatomegaly, not elsewhere classified: Secondary | ICD-10-CM

## 2022-10-22 DIAGNOSIS — K760 Fatty (change of) liver, not elsewhere classified: Secondary | ICD-10-CM

## 2022-10-28 ENCOUNTER — Other Ambulatory Visit: Payer: Self-pay | Admitting: General Surgery

## 2022-10-28 DIAGNOSIS — R160 Hepatomegaly, not elsewhere classified: Secondary | ICD-10-CM

## 2022-10-28 DIAGNOSIS — K760 Fatty (change of) liver, not elsewhere classified: Secondary | ICD-10-CM

## 2022-11-21 ENCOUNTER — Ambulatory Visit: Payer: Commercial Managed Care - PPO | Admitting: Nurse Practitioner

## 2022-11-26 ENCOUNTER — Other Ambulatory Visit: Payer: Self-pay

## 2022-11-27 ENCOUNTER — Other Ambulatory Visit (HOSPITAL_COMMUNITY): Payer: Self-pay

## 2022-12-03 ENCOUNTER — Ambulatory Visit
Admission: RE | Admit: 2022-12-03 | Discharge: 2022-12-03 | Disposition: A | Discharge status: Home / Self Care | Payer: Commercial Managed Care - PPO | Source: Ambulatory Visit | Attending: General Surgery | Admitting: General Surgery

## 2022-12-03 DIAGNOSIS — R1900 Intra-abdominal and pelvic swelling, mass and lump, unspecified site: Secondary | ICD-10-CM | POA: Diagnosis not present

## 2022-12-03 DIAGNOSIS — K760 Fatty (change of) liver, not elsewhere classified: Secondary | ICD-10-CM

## 2022-12-03 DIAGNOSIS — R160 Hepatomegaly, not elsewhere classified: Secondary | ICD-10-CM

## 2022-12-03 DIAGNOSIS — D134 Benign neoplasm of liver: Secondary | ICD-10-CM | POA: Diagnosis not present

## 2022-12-03 MED ORDER — GADOXETATE DISODIUM 0.25 MMOL/ML IV SOLN
10.0000 mL | Freq: Once | INTRAVENOUS | Status: AC | PRN
  Administered 2022-12-03: 10 mL via INTRAVENOUS

## 2022-12-20 ENCOUNTER — Other Ambulatory Visit: Payer: Self-pay | Admitting: Internal Medicine

## 2022-12-22 DIAGNOSIS — R160 Hepatomegaly, not elsewhere classified: Secondary | ICD-10-CM | POA: Diagnosis not present

## 2022-12-22 DIAGNOSIS — K760 Fatty (change of) liver, not elsewhere classified: Secondary | ICD-10-CM | POA: Diagnosis not present

## 2022-12-22 NOTE — Telephone Encounter (Signed)
Requested Prescriptions  Pending Prescriptions Disp Refills   lisinopril (ZESTRIL) 20 MG tablet [Pharmacy Med Name: LISINOPRIL TAB 20MG] 90 tablet 1    Sig: TAKE 1 TABLET (20 MG TOTAL) BY MOUTH DAILY.     Cardiovascular:  ACE Inhibitors Passed - 12/20/2022  3:21 AM      Passed - Cr in normal range and within 180 days    Creatinine, Ser  Date Value Ref Range Status  07/04/2022 0.92 0.57 - 1.00 mg/dL Final         Passed - K in normal range and within 180 days    Potassium  Date Value Ref Range Status  07/04/2022 3.6 3.5 - 5.2 mmol/L Final         Passed - Patient is not pregnant      Passed - Last BP in normal range    BP Readings from Last 1 Encounters:  10/08/22 104/76         Passed - Valid encounter within last 6 months    Recent Outpatient Visits           2 months ago Benign hypertension   [place] at [place], Jesse Sans, MD   5 months ago Annual physical exam   [place] at [facility], Jesse Sans, MD   11 months ago Benign hypertension   [place] at [place], Jesse Sans, MD   1 year ago Annual physical exam   [place] at [facility], Jesse Sans, MD   1 year ago Benign hypertension   [place] at [facility], Jesse Sans, MD       Future Appointments             In 3 weeks Army Melia Jesse Sans, MD [place] at [facility], [facility]   In 6 months Glean Hess, MD [place] at [place], [facility]             metoprolol succinate (TOPROL-XL) 25 MG 24 hr tablet [Pharmacy Med Name: METOPROLOL TAB 25MG ER] 90 tablet 1    Sig: TAKE 1 TABLET (25 MG TOTAL) BY MOUTH DAILY.     Cardiovascular:  Beta Blockers Failed - 12/20/2022  3:21 AM      Failed - Last Heart Rate in  normal range    Pulse Readings from Last 1 Encounters:  10/08/22 (!) 112         Passed - Last BP in normal range    BP Readings from Last 1 Encounters:  10/08/22 104/76         Passed - Valid encounter within last 6 months    Recent Outpatient Visits           2 months ago Benign hypertension   [facility] at [facility], Jesse Sans, MD   5 months ago Annual physical exam   [place] at [facility], Jesse Sans, MD   11 months ago Benign hypertension   [place] at [facility], Jesse Sans, MD   1 year ago Annual physical exam   [place] at [facility], Jesse Sans, MD  1 year ago Benign hypertension   [place] [facility] at [facility], Jesse Sans, MD       Future Appointments             In 3 weeks Army Melia Jesse Sans, MD [place] at [facility], [facility]   In 6 months Army Melia Jesse Sans, MD [place] at [place], [facility]             hydrochlorothiazide (HYDRODIURIL) 25 MG tablet [Pharmacy Med Name: HYDROCHLOROT TAB 25MG] 90 tablet 1    Sig: TAKE 1 TABLET (25 MG TOTAL) BY MOUTH DAILY.     Cardiovascular: Diuretics - Thiazide Passed - 12/20/2022  3:21 AM      Passed - Cr in normal range and within 180 days    Creatinine, Ser  Date Value Ref Range Status  07/04/2022 0.92 0.57 - 1.00 mg/dL Final         Passed - K in normal range and within 180 days    Potassium  Date Value Ref Range Status  07/04/2022 3.6 3.5 - 5.2 mmol/L Final         Passed - Na in normal range and within 180 days    Sodium  Date Value Ref Range Status  07/04/2022 138 134 - 144 mmol/L Final         Passed - Last BP in normal range    BP Readings from Last 1 Encounters:  10/08/22 104/76         Passed - Valid  encounter within last 6 months    Recent Outpatient Visits           2 months ago Benign hypertension   [place] at [place], Jesse Sans, MD   5 months ago Annual physical exam   [place] at [facility], Jesse Sans, MD   11 months ago Benign hypertension   [place] at [facility], Jesse Sans, MD   1 year ago Annual physical exam   [place] at [facility], Jesse Sans, MD   1 year ago Benign hypertension   [place] at [facility], Jesse Sans, MD       Future Appointments             In 3 weeks Army Melia Jesse Sans, MD [place] at [facility], [facility]   In 6 months Army Melia, Jesse Sans, MD [place] at [facility], [facility]

## 2022-12-24 ENCOUNTER — Other Ambulatory Visit: Payer: Self-pay

## 2023-01-07 ENCOUNTER — Encounter: Payer: Self-pay | Admitting: Internal Medicine

## 2023-01-08 ENCOUNTER — Other Ambulatory Visit: Payer: Self-pay | Admitting: Internal Medicine

## 2023-01-08 DIAGNOSIS — Z6837 Body mass index (BMI) 37.0-37.9, adult: Secondary | ICD-10-CM

## 2023-01-08 MED ORDER — WEGOVY 1.7 MG/0.75ML ~~LOC~~ SOAJ: 1.7000 mg | SUBCUTANEOUS | 0 refills | Status: DC

## 2023-01-14 ENCOUNTER — Encounter: Payer: Self-pay | Admitting: Internal Medicine

## 2023-01-14 ENCOUNTER — Telehealth: Payer: Self-pay

## 2023-01-14 ENCOUNTER — Ambulatory Visit: Payer: Commercial Managed Care - PPO | Admitting: Internal Medicine

## 2023-01-14 VITALS — BP 118/78 | HR 87 | Ht 67.5 in | Wt 198.0 lb

## 2023-01-14 DIAGNOSIS — K760 Fatty (change of) liver, not elsewhere classified: Secondary | ICD-10-CM | POA: Diagnosis not present

## 2023-01-14 DIAGNOSIS — I10 Essential (primary) hypertension: Secondary | ICD-10-CM

## 2023-01-14 DIAGNOSIS — Z6830 Body mass index (BMI) 30.0-30.9, adult: Secondary | ICD-10-CM | POA: Diagnosis not present

## 2023-01-14 DIAGNOSIS — D134 Benign neoplasm of liver: Secondary | ICD-10-CM | POA: Diagnosis not present

## 2023-01-14 DIAGNOSIS — E782 Mixed hyperlipidemia: Secondary | ICD-10-CM | POA: Diagnosis not present

## 2023-01-14 NOTE — Assessment & Plan Note (Addendum)
Recent MRI showed the adenomas were shrinking.

## 2023-01-14 NOTE — Telephone Encounter (Signed)
Completed PA over the phone with [facility] for [facility] 1.7 mg.  PA#- [id number]

## 2023-01-14 NOTE — Assessment & Plan Note (Addendum)
Recent MRI showed worsening steatosis despite weight loss Referred to [place]

## 2023-01-14 NOTE — Assessment & Plan Note (Addendum)
Clinically stable exam with well controlled BP on lisinopril, hctz and metoprolol.  She recently cut the lisinopril in half due to low BP. Tolerating medications without side effects. Pt to continue current regimen and low sodium diet.

## 2023-01-14 NOTE — Assessment & Plan Note (Signed)
Hopefully improved with recent weight loss She will get Doctor's Day labs in 2 weeks and forward them to me

## 2023-01-14 NOTE — Progress Notes (Signed)
Date:  01/14/2023   Name:  Darlene Munoz   DOB:  [birth year]   MRN:  [id number]   Chief Complaint: Hypertension and Weight Check  Hypertension This is a chronic problem. The problem is controlled. Pertinent negatives include no chest pain, headaches, palpitations or shortness of breath. Past treatments include ACE inhibitors, beta blockers and diuretics. The current treatment provides significant improvement.   Weight management - pt is here for weight management follow up.  Started on [place] on 06/24/22.  Doing well without side effects.  Starting weight 241 lbs.   Today's weight 198 lbs.  Current diet is low carb and current exercise routine is walking three times per week. We are working to get the next dose approved - 1.7 mg.  Hepatic steatosis - recent visit with GI surgery showed the adenomas were smaller but that the steatosis appeared worse despite weight loss.  She has been referred to Hepatology at [facility].  IMPRESSION: 1. Continued evidence of marked hepatic steatosis. 2. Continued decrease in size and number of hepatic adenomas, most notably in the inferior aspect of the lateral segment LEFT hepatic lobe. Given multiplicity continued follow-up may be warranted. There is also severe hepatic steatosis as outlined above for which continued clinical surveillance for any signs of developing steatohepatitis is recommended. 3. No definite new hepatic abnormalities.  Lab Results  Component Value Date   NA 138 07/04/2022   K 3.6 07/04/2022   CO2 24 07/04/2022   GLUCOSE 77 07/04/2022   BUN 13 07/04/2022   CREATININE 0.92 07/04/2022   CALCIUM 9.3 07/04/2022   EGFR 82 07/04/2022   GFRNONAA 79 06/25/2020   Lab Results  Component Value Date   CHOL 227 (H) 07/04/2022   HDL 47 07/04/2022   LDLCALC 155 (H) 07/04/2022   TRIG 136 07/04/2022   CHOLHDL 4.8 (H) 07/04/2022   Lab Results  Component Value Date   TSH 3.620 07/04/2022   Lab Results  Component Value Date   HGBA1C 5.5  07/04/2022   Lab Results  Component Value Date   WBC 8.9 07/04/2022   HGB 14.5 07/04/2022   HCT 42.4 07/04/2022   MCV 88 07/04/2022   PLT 127 (L) 07/04/2022   Lab Results  Component Value Date   ALT 96 (H) 07/04/2022   AST 66 (H) 07/04/2022   ALKPHOS 108 07/04/2022   BILITOT 0.5 07/04/2022   No results found for: "25OHVITD2", "25OHVITD3", "VD25OH"   Review of Systems  Constitutional:  Negative for fatigue and unexpected weight change.  HENT:  Negative for nosebleeds.   Eyes:  Negative for visual disturbance.  Respiratory:  Negative for cough, chest tightness, shortness of breath and wheezing.   Cardiovascular:  Negative for chest pain, palpitations and leg swelling.  Gastrointestinal:  Negative for abdominal pain, constipation and diarrhea.  Neurological:  Negative for dizziness, weakness, light-headedness and headaches.    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 No pertinent surgical history.  Social History   Tobacco Use   Smoking status: Never   Smokeless tobacco: Never  Vaping Use   Vaping Use: Never used  Substance Use  Topics   Alcohol use: No    Alcohol/week: 0.0 standard drinks of alcohol     Medication list has been reviewed and updated.  Current Meds  Medication Sig   cetirizine (ZYRTEC) 10 MG tablet Take 10 mg by mouth as needed.    hydrochlorothiazide (HYDRODIURIL) 25 MG tablet TAKE 1 TABLET (25 MG TOTAL) BY MOUTH DAILY.   lisinopril (ZESTRIL) 20 MG tablet TAKE 1 TABLET (20 MG TOTAL) BY MOUTH DAILY. (Patient taking differently: Take 10 mg by mouth daily.)    metoprolol succinate (TOPROL-XL) 25 MG 24 hr tablet TAKE 1 TABLET (25 MG TOTAL) BY MOUTH DAILY.   Semaglutide-Weight Management (WEGOVY) 1 MG/0.5ML SOAJ Inject 1 mg into the skin once a week.   Semaglutide-Weight Management (WEGOVY) 1.7 MG/0.75ML SOAJ Inject 1.7 mg into the skin once a week.       10/08/2022    1:23 PM 07/04/2022    8:00 AM 01/08/2022    1:23 PM 06/28/2021    7:58 AM  GAD 7 : Generalized Anxiety Score  Nervous, Anxious, on Edge 0 0 0 0  Control/stop worrying 0 0 0 0  Worry too much - different things 0 0 0 0  Trouble relaxing 0 0 0 0  Restless 0 0 0 0  Easily annoyed or irritable 0 0 0 0  Afraid - awful might happen 0 0 0 0  Total GAD 7 Score 0 0 0 0  Anxiety Difficulty Not difficult at all Not difficult at all Not difficult at all        10/08/2022    1:23 PM 07/04/2022    8:00 AM 01/08/2022    1:23 PM  Depression screen PHQ 2/9  Decreased Interest 0 0 0  Down, Depressed, Hopeless 0 0 0  PHQ - 2 Score 0 0 0  Altered sleeping 0 0 0  Tired, decreased energy 0 0 0  Change in appetite 0 0 0  Feeling bad or failure about yourself  0 0 0  Trouble concentrating 0 0 0  Moving slowly or fidgety/restless 0 0 0  Suicidal thoughts 0 0 0  PHQ-9 Score 0 0 0  Difficult doing work/chores Not difficult at all Not difficult at all Not difficult at all    BP Readings from Last 3 Encounters:  01/14/23 118/78  10/08/22 104/76  07/04/22 128/76    Physical Exam Vitals and nursing note reviewed.  Constitutional:      General: She is not in acute distress.    Appearance: Normal appearance. She is well-developed.  HENT:     Head: Normocephalic and atraumatic.  Neck:     Vascular: No carotid bruit.  Cardiovascular:     Rate and Rhythm: Normal rate and regular rhythm.  Pulmonary:     Effort: Pulmonary effort is normal. No respiratory distress.     Breath sounds: No wheezing or rhonchi.  Musculoskeletal:     Cervical back: Normal range of motion.     Right lower leg: No  edema.     Left lower leg: No edema.  Lymphadenopathy:     Cervical: No cervical adenopathy.  Skin:    General: Skin is warm and dry.     Capillary Refill: Capillary refill takes less than 2 seconds.     Findings: No rash.  Neurological:     Mental Status: She is alert and oriented to person, place, and time.  Psychiatric:        Mood and Affect: Mood normal.  Behavior: Behavior normal.     Wt Readings from Last 3 Encounters:  01/14/23 198 lb (89.8 kg)  10/08/22 216 lb (98 kg)  07/04/22 241 lb (109.3 kg)    BP 118/78   Pulse 87   Ht 5' 7.5" (1.715 m)   Wt 198 lb (89.8 kg)   SpO2 98%   BMI 30.55 kg/m   Assessment and Plan: Problem List Items Addressed This Visit       Cardiovascular and Mediastinum   Benign hypertension - Primary (Chronic)    Clinically stable exam with well controlled BP on lisinopril, hctz and metoprolol.  She recently cut the lisinopril in half due to low BP. Tolerating medications without side effects. Pt to continue current regimen and low sodium diet.         Digestive   Hepatic steatosis    Recent MRI showed worsening steatosis despite weight loss Referred to [facility] Hepatology      Adenoma of liver (Chronic)    Recent MRI showed the adenomas were shrinking.        Other   Hyperlipidemia, mixed (Chronic)    Hopefully improved with recent weight loss She will get Doctor's Day labs in 2 weeks and forward them to me      Other Visit Diagnoses     BMI 30.0-30.9,adult       doing well with Mancel Parsons will increase to 1.7 mg to maximize benefit since coverage ends 02/09/23        Partially dictated using [employer]. Any errors are unintentional.  Halina Maidens, MD [facility]  01/14/2023

## 2023-01-19 NOTE — Telephone Encounter (Signed)
Darlene Munoz was denied by insurance because of general exclusion. Pharmacy benefit has recently changed. Darlene Munoz is excluded from coverage. Covered alternatives are phentermine, qysmia, & orlistat- all which require prior authorization.  Liston Thum

## 2023-01-20 ENCOUNTER — Other Ambulatory Visit: Payer: Self-pay | Admitting: Internal Medicine

## 2023-01-20 ENCOUNTER — Other Ambulatory Visit: Payer: Self-pay

## 2023-01-20 DIAGNOSIS — Z6837 Body mass index (BMI) 37.0-37.9, adult: Secondary | ICD-10-CM

## 2023-01-20 MED ORDER — WEGOVY 1.7 MG/0.75ML ~~LOC~~ SOAJ
1.7000 mg | SUBCUTANEOUS | 0 refills | Status: DC
  Filled 2023-01-20: qty 3, 28d supply, fill #0

## 2023-01-21 ENCOUNTER — Encounter: Payer: Self-pay | Admitting: Internal Medicine

## 2023-01-21 ENCOUNTER — Other Ambulatory Visit: Payer: Self-pay | Admitting: Internal Medicine

## 2023-01-21 ENCOUNTER — Other Ambulatory Visit: Payer: Self-pay

## 2023-01-21 DIAGNOSIS — Z6837 Body mass index (BMI) 37.0-37.9, adult: Secondary | ICD-10-CM

## 2023-01-21 DIAGNOSIS — H5213 Myopia, bilateral: Secondary | ICD-10-CM | POA: Diagnosis not present

## 2023-01-21 MED ORDER — ZEPBOUND 7.5 MG/0.5ML ~~LOC~~ SOAJ
7.5000 mg | SUBCUTANEOUS | 0 refills | Status: DC
  Filled 2023-01-21 – 2023-05-06 (×3): qty 2, 28d supply, fill #0

## 2023-01-28 LAB — BASIC METABOLIC PANEL
BUN: 14 (ref 4–21)
CO2: 26 — AB (ref 13–22)
Chloride: 101 (ref 99–108)
Creatinine: 0.8 (ref 0.5–1.1)
Glucose: 76
Potassium: 4 mEq/L (ref 3.5–5.1)
Sodium: 142 (ref 137–147)

## 2023-01-28 LAB — CBC AND DIFFERENTIAL
HCT: 43 (ref 36–46)
Hemoglobin: 14.4 (ref 12.0–16.0)
Platelets: 299 10*3/uL (ref 150–400)
WBC: 6.9

## 2023-01-28 LAB — HEPATIC FUNCTION PANEL
ALT: 52 U/L — AB (ref 7–35)
AST: 40 — AB (ref 13–35)
Alkaline Phosphatase: 88 (ref 25–125)
Bilirubin, Total: 0.4

## 2023-01-28 LAB — LIPID PANEL
Cholesterol: 203 — AB (ref 0–200)
HDL: 38 (ref 35–70)
LDL Cholesterol: 146
Triglycerides: 106 (ref 40–160)

## 2023-01-28 LAB — COMPREHENSIVE METABOLIC PANEL
Albumin: 4.4 (ref 3.5–5.0)
Calcium: 9.9 (ref 8.7–10.7)
Globulin: 2.7
eGFR: 93

## 2023-01-28 LAB — HEMOGLOBIN A1C: Hemoglobin A1C: 5.2

## 2023-01-28 LAB — CBC: RBC: 4.77 (ref 3.87–5.11)

## 2023-01-28 LAB — TSH: TSH: 2.48 (ref 0.41–5.90)

## 2023-02-16 ENCOUNTER — Encounter: Payer: Self-pay | Admitting: Internal Medicine

## 2023-03-12 ENCOUNTER — Other Ambulatory Visit: Payer: Self-pay

## 2023-03-12 ENCOUNTER — Encounter: Payer: Self-pay | Admitting: Pharmacist

## 2023-03-13 ENCOUNTER — Other Ambulatory Visit: Payer: Self-pay

## 2023-03-13 ENCOUNTER — Encounter: Payer: Self-pay | Admitting: Internal Medicine

## 2023-03-13 ENCOUNTER — Other Ambulatory Visit: Payer: Self-pay | Admitting: Internal Medicine

## 2023-03-13 DIAGNOSIS — Z6837 Body mass index (BMI) 37.0-37.9, adult: Secondary | ICD-10-CM

## 2023-03-13 MED ORDER — ZEPBOUND 5 MG/0.5ML ~~LOC~~ SOAJ
5.0000 mg | SUBCUTANEOUS | 0 refills | Status: DC
  Filled 2023-03-13: qty 6, 84d supply, fill #0
  Filled 2023-03-17: qty 2, 28d supply, fill #0
  Filled 2023-05-06: qty 2, 28d supply, fill #1

## 2023-03-17 ENCOUNTER — Other Ambulatory Visit: Payer: Self-pay

## 2023-03-18 DIAGNOSIS — K7401 Hepatic fibrosis, early fibrosis: Secondary | ICD-10-CM | POA: Diagnosis not present

## 2023-03-18 DIAGNOSIS — I10 Essential (primary) hypertension: Secondary | ICD-10-CM | POA: Diagnosis not present

## 2023-03-18 DIAGNOSIS — K760 Fatty (change of) liver, not elsewhere classified: Secondary | ICD-10-CM | POA: Diagnosis not present

## 2023-03-18 DIAGNOSIS — D134 Benign neoplasm of liver: Secondary | ICD-10-CM | POA: Diagnosis not present

## 2023-03-18 DIAGNOSIS — R748 Abnormal levels of other serum enzymes: Secondary | ICD-10-CM | POA: Diagnosis not present

## 2023-03-18 DIAGNOSIS — E7849 Other hyperlipidemia: Secondary | ICD-10-CM | POA: Diagnosis not present

## 2023-03-18 DIAGNOSIS — D693 Immune thrombocytopenic purpura: Secondary | ICD-10-CM | POA: Diagnosis not present

## 2023-05-06 ENCOUNTER — Other Ambulatory Visit: Payer: Self-pay

## 2023-06-08 ENCOUNTER — Other Ambulatory Visit: Payer: Self-pay

## 2023-06-08 ENCOUNTER — Other Ambulatory Visit: Payer: Self-pay | Admitting: Internal Medicine

## 2023-06-08 DIAGNOSIS — Z6837 Body mass index (BMI) 37.0-37.9, adult: Secondary | ICD-10-CM

## 2023-06-08 MED FILL — Tirzepatide (Weight Mngmt) Soln Auto-Injector 7.5 MG/0.5ML: SUBCUTANEOUS | 28 days supply | Qty: 2 | Fill #0 | Status: AC

## 2023-06-11 ENCOUNTER — Other Ambulatory Visit: Payer: Self-pay

## 2023-06-18 ENCOUNTER — Encounter: Payer: Self-pay | Admitting: Internal Medicine

## 2023-06-18 ENCOUNTER — Other Ambulatory Visit: Payer: Commercial Managed Care - PPO

## 2023-06-18 ENCOUNTER — Other Ambulatory Visit: Payer: Self-pay

## 2023-06-18 ENCOUNTER — Other Ambulatory Visit: Payer: Self-pay | Admitting: Nurse Practitioner

## 2023-06-18 ENCOUNTER — Ambulatory Visit (INDEPENDENT_AMBULATORY_CARE_PROVIDER_SITE_OTHER): Payer: Commercial Managed Care - PPO | Admitting: Internal Medicine

## 2023-06-18 ENCOUNTER — Other Ambulatory Visit (HOSPITAL_COMMUNITY)
Admission: RE | Admit: 2023-06-18 | Discharge: 2023-06-18 | Disposition: A | Discharge status: Home / Self Care | Payer: Commercial Managed Care - PPO | Source: Ambulatory Visit | Attending: Internal Medicine | Admitting: Internal Medicine

## 2023-06-18 VITALS — BP 124/76 | HR 112 | Ht 67.5 in | Wt 182.0 lb

## 2023-06-18 DIAGNOSIS — D134 Benign neoplasm of liver: Secondary | ICD-10-CM | POA: Diagnosis not present

## 2023-06-18 DIAGNOSIS — D693 Immune thrombocytopenic purpura: Secondary | ICD-10-CM

## 2023-06-18 DIAGNOSIS — E782 Mixed hyperlipidemia: Secondary | ICD-10-CM

## 2023-06-18 DIAGNOSIS — K760 Fatty (change of) liver, not elsewhere classified: Secondary | ICD-10-CM

## 2023-06-18 DIAGNOSIS — Z124 Encounter for screening for malignant neoplasm of cervix: Secondary | ICD-10-CM | POA: Insufficient documentation

## 2023-06-18 DIAGNOSIS — Z0000 Encounter for general adult medical examination without abnormal findings: Secondary | ICD-10-CM

## 2023-06-18 DIAGNOSIS — Z1231 Encounter for screening mammogram for malignant neoplasm of breast: Secondary | ICD-10-CM

## 2023-06-18 DIAGNOSIS — Z131 Encounter for screening for diabetes mellitus: Secondary | ICD-10-CM | POA: Diagnosis not present

## 2023-06-18 DIAGNOSIS — I10 Essential (primary) hypertension: Secondary | ICD-10-CM

## 2023-06-18 LAB — POCT URINALYSIS DIPSTICK
Bilirubin, UA: NEGATIVE
Blood, UA: NEGATIVE
Glucose, UA: NEGATIVE
Ketones, UA: NEGATIVE
Leukocytes, UA: NEGATIVE
Nitrite, UA: NEGATIVE
Protein, UA: NEGATIVE
Spec Grav, UA: >=1.03 — AB (ref 1.010–1.025)
Urobilinogen, UA: 0.2 E.U./dL
pH, UA: 5 (ref 5.0–8.0)

## 2023-06-18 MED ORDER — LISINOPRIL 10 MG PO TABS
10.0000 mg | ORAL_TABLET | Freq: Every day | ORAL | 1 refills | Status: DC
Start: 2023-06-18 — End: 2024-03-10
  Filled 2023-06-18: qty 90, 90d supply, fill #0
  Filled 2023-09-28: qty 90, 90d supply, fill #1

## 2023-06-18 MED ORDER — METOPROLOL SUCCINATE ER 25 MG PO TB24
25.0000 mg | ORAL_TABLET | Freq: Every day | ORAL | 1 refills | Status: DC
Start: 2023-06-18 — End: 2023-11-26
  Filled 2023-06-18: qty 90, 90d supply, fill #0
  Filled 2023-09-28: qty 90, 90d supply, fill #1

## 2023-06-18 NOTE — Assessment & Plan Note (Addendum)
Mild chronic elevations. MRI showed hepatic adenomas and steatosis Being followed by GI specialist at [facility] Most recent LFTs were normal

## 2023-06-18 NOTE — Assessment & Plan Note (Addendum)
Last LFTs were improved MRI 11/2022 showed the adenomas to be smaller Now being followed by liver specialist at [facility] - fibrosis score was low Recommended annual MRI and follow up only if needed

## 2023-06-18 NOTE — Progress Notes (Signed)
Date:  06/18/2023   Name:  Darlene Munoz   DOB:  [birth year]   MRN:  [id number]   Chief Complaint: Annual Exam (Breast exam with pap) Darlene Munoz is a 42 y.o. female who presents today for her Complete Annual Exam. She feels well. She reports exercising stationary bike 3 days a week. She reports she is sleeping well. Breast complaints none. She continues on Zepbound every other week and continues to lose weight.  Mammogram: due September DEXA: none Pap smear: 02/2018 neg/neg Colonoscopy: none  Health Maintenance Due  Topic Date Due   PAP SMEAR-Modifier  02/19/2023   INFLUENZA VACCINE  06/11/2023    Immunization History  Administered Date(s) Administered   Influenza,inj,Quad PF,6+ Mos 08/03/2020   Influenza-Unspecified 08/06/2018, 08/05/2019, 08/16/2021, 08/15/2022   Moderna Sars-Covid-2 Vaccination 07/14/2021   PFIZER(Purple Top)SARS-COV-2 Vaccination 11/22/2019, 12/13/2019, 08/24/2020   Tdap 01/14/2008, 02/18/2018   Unspecified SARS-COV-2 Vaccination 07/30/2022    Hypertension This is a chronic problem. The problem is controlled. Pertinent negatives include no chest pain, headaches, palpitations or shortness of breath. Past treatments include beta blockers and ACE inhibitors. The current treatment provides significant improvement.    Lab Results  Component Value Date   NA 142 01/28/2023   K 4.0 01/28/2023   CO2 26 (A) 01/28/2023   GLUCOSE 77 07/04/2022   BUN 14 01/28/2023   CREATININE 0.8 01/28/2023   CALCIUM 9.9 01/28/2023   EGFR 93 01/28/2023   GFRNONAA 79 06/25/2020   Lab Results  Component Value Date   CHOL 203 (A) 01/28/2023   HDL 38 01/28/2023   LDLCALC 146 01/28/2023   TRIG 106 01/28/2023   CHOLHDL 4.8 (H) 07/04/2022   Lab Results  Component Value Date   TSH 2.48 01/28/2023   Lab Results  Component Value Date   HGBA1C 5.2 01/28/2023   Lab Results  Component Value Date   WBC 6.9 01/28/2023   HGB 14.4 01/28/2023   HCT 43 01/28/2023   MCV 88  07/04/2022   PLT 299 01/28/2023   Lab Results  Component Value Date   ALT 52 (A) 01/28/2023   AST 40 (A) 01/28/2023   ALKPHOS 88 01/28/2023   BILITOT 0.5 07/04/2022   No results found for: "25OHVITD2", "25OHVITD3", "VD25OH"   Review of Systems  Constitutional:  Negative for chills, fatigue and fever.  HENT:  Negative for congestion, hearing loss, tinnitus, trouble swallowing and voice change.   Eyes:  Negative for visual disturbance.  Respiratory:  Negative for cough, chest tightness, shortness of breath and wheezing.   Cardiovascular:  Negative for chest pain, palpitations and leg swelling.  Gastrointestinal:  Negative for abdominal pain, constipation, diarrhea and vomiting.  Endocrine: Negative for polydipsia and polyuria.  Genitourinary:  Negative for dysuria, frequency, genital sores, vaginal bleeding and vaginal discharge.  Musculoskeletal:  Negative for arthralgias, gait problem and joint swelling.  Skin:  Negative for color change and rash.  Neurological:  Negative for dizziness, tremors, light-headedness and headaches.  Hematological:  Negative for adenopathy. Does not bruise/bleed easily.  Psychiatric/Behavioral:  Negative for dysphoric mood and sleep disturbance. The patient is not nervous/anxious.     Patient Active Problem List   Diagnosis Date Noted   Adenoma of liver 08/06/2021   Hepatic steatosis 08/06/2021   Migraine with aura and without status migrainosus, not intractable 06/25/2020   Parasomnia 07/29/2017   Benign hypertension 09/30/2015   Immune thrombocytopenia (HCC) 09/30/2015   Family planning 09/30/2015   Elevated LFTs 09/30/2015   Hyperlipidemia,  mixed 09/30/2015    No Known Allergies  History reviewed. No pertinent surgical history.  Social History   Tobacco Use   Smoking status: Never   Smokeless tobacco: Never  Vaping Use   Vaping status: Never Used  Substance Use Topics   Alcohol use: No    Alcohol/week: 0.0 standard drinks of alcohol      Medication list has been reviewed and updated.  Current Meds  Medication Sig   cetirizine (ZYRTEC) 10 MG tablet Take 10 mg by mouth as needed.    tirzepatide (ZEPBOUND) 7.5 MG/0.5ML Pen Inject 7.5 mg into the skin once a week.   [DISCONTINUED] lisinopril (ZESTRIL) 20 MG tablet TAKE 1 TABLET (20 MG TOTAL) BY MOUTH DAILY. (Patient taking differently: Take 10 mg by mouth daily.)   [DISCONTINUED] metoprolol succinate (TOPROL-XL) 25 MG 24 hr tablet TAKE 1 TABLET (25 MG TOTAL) BY MOUTH DAILY.   [DISCONTINUED] tirzepatide (ZEPBOUND) 5 MG/0.5ML Pen Inject 5 mg into the skin once a week.       06/18/2023    8:10 AM 10/08/2022    1:23 PM 07/04/2022    8:00 AM 01/08/2022    1:23 PM  GAD 7 : Generalized Anxiety Score  Nervous, Anxious, on Edge 0 0 0 0  Control/stop worrying 0 0 0 0  Worry too much - different things 0 0 0 0  Trouble relaxing 0 0 0 0  Restless 0 0 0 0  Easily annoyed or irritable 0 0 0 0  Afraid - awful might happen 0 0 0 0  Total GAD 7 Score 0 0 0 0  Anxiety Difficulty Not difficult at all Not difficult at all Not difficult at all Not difficult at all       06/18/2023    8:10 AM 10/08/2022    1:23 PM 07/04/2022    8:00 AM  Depression screen PHQ 2/9  Decreased Interest 0 0 0  Down, Depressed, Hopeless 0 0 0  PHQ - 2 Score 0 0 0  Altered sleeping 0 0 0  Tired, decreased energy 0 0 0  Change in appetite 0 0 0  Feeling bad or failure about yourself  0 0 0  Trouble concentrating 0 0 0  Moving slowly or fidgety/restless 0 0 0  Suicidal thoughts 0 0 0  PHQ-9 Score 0 0 0  Difficult doing work/chores Not difficult at all Not difficult at all Not difficult at all    BP Readings from Last 3 Encounters:  06/18/23 124/76  01/14/23 118/78  10/08/22 104/76    Physical Exam Vitals and nursing note reviewed.  Constitutional:      General: She is not in acute distress.    Appearance: She is well-developed.  HENT:     Head: Normocephalic and atraumatic.     Right Ear:  Tympanic membrane and ear canal normal.     Left Ear: Tympanic membrane and ear canal normal.     Nose:     Right Sinus: No maxillary sinus tenderness.     Left Sinus: No maxillary sinus tenderness.  Eyes:     General: No scleral icterus.       Right eye: No discharge.        Left eye: No discharge.     Conjunctiva/sclera: Conjunctivae normal.  Neck:     Thyroid: No thyromegaly.     Vascular: No carotid bruit.  Cardiovascular:     Rate and Rhythm: Normal rate and regular rhythm.  Pulses: Normal pulses.     Heart sounds: Normal heart sounds.  Pulmonary:     Effort: Pulmonary effort is normal. No respiratory distress.     Breath sounds: No wheezing.  Chest:  Breasts:    Right: No mass, nipple discharge, skin change or tenderness.     Left: No mass, nipple discharge, skin change or tenderness.  Abdominal:     General: Bowel sounds are normal.     Palpations: Abdomen is soft.     Tenderness: There is no abdominal tenderness.  Genitourinary:    Labia:        Right: No tenderness, lesion or injury.        Left: No tenderness, lesion or injury.      Vagina: Normal.     Cervix: Friability present.     Uterus: Normal.      Adnexa: Right adnexa normal and left adnexa normal.     Comments: Pap with HPV obtained Musculoskeletal:     Cervical back: Normal range of motion. No erythema.     Right lower leg: No edema.     Left lower leg: No edema.  Lymphadenopathy:     Cervical: No cervical adenopathy.  Skin:    General: Skin is warm and dry.     Findings: No rash.  Neurological:     Mental Status: She is alert and oriented to person, place, and time.     Cranial Nerves: No cranial nerve deficit.     Sensory: No sensory deficit.     Deep Tendon Reflexes: Reflexes are normal and symmetric.  Psychiatric:        Attention and Perception: Attention normal.        Mood and Affect: Mood normal.     Wt Readings from Last 3 Encounters:  06/18/23 182 lb (82.6 kg)  01/14/23 198 lb  (89.8 kg)  10/08/22 216 lb (98 kg)    BP 124/76   Pulse (!) 112   Wt 182 lb (82.6 kg)   LMP 05/14/2023 (Approximate)   SpO2 98%   BMI 28.08 kg/m   Assessment and Plan:  Problem List Items Addressed This Visit       Unprioritized   Immune thrombocytopenia (HCC) (Chronic)   Relevant Orders   CBC with Differential/Platelet   Hyperlipidemia, mixed (Chronic)    Managed with diet and weight loss Lab Results  Component Value Date   LDLCALC 146 01/28/2023         Relevant Medications   metoprolol succinate (TOPROL-XL) 25 MG 24 hr tablet   lisinopril (ZESTRIL) 10 MG tablet   Other Relevant Orders   Lipid panel   TSH   POCT urinalysis dipstick   Benign hypertension (Chronic)    Normal exam with stable BP on metoprolol and lisinopril 10 mg.   Hydrochlorothiazide stopped recently due to weight loss and low readings. No concerns or side effects to current medication. No change in regimen; continue low sodium diet.       Relevant Medications   metoprolol succinate (TOPROL-XL) 25 MG 24 hr tablet   lisinopril (ZESTRIL) 10 MG tablet   Other Relevant Orders   CBC with Differential/Platelet   Comprehensive metabolic panel   Adenoma of liver (Chronic)    Last LFTs were improved MRI 11/2022 showed the adenomas to be smaller Now being followed by liver specialist at [facility] - fibrosis score was low Recommended annual MRI and follow up only if needed      Relevant Orders  Comprehensive metabolic panel   Other Visit Diagnoses     Annual physical exam    -  Primary   doing well with diet, weight loss and exercise mammogram at age 62   Relevant Orders   CBC with Differential/Platelet   Comprehensive metabolic panel   Hemoglobin A1c   Lipid panel   TSH   Encounter for screening mammogram for breast cancer       Relevant Orders   MM 3D SCREENING MAMMOGRAM BILATERAL BREAST   Encounter for screening for cervical cancer       Relevant Orders   Cytology - PAP   Screening for  diabetes mellitus       Relevant Orders   Hemoglobin A1c       Return in about 6 months (around 12/19/2023) for HTN.    Reubin Milan, MD [facility] [place]

## 2023-06-18 NOTE — Assessment & Plan Note (Signed)
Managed with diet and weight loss Lab Results  Component Value Date   LDLCALC 146 01/28/2023

## 2023-06-18 NOTE — Assessment & Plan Note (Addendum)
Normal exam with stable BP on metoprolol and lisinopril 10 mg.   Hydrochlorothiazide stopped recently due to weight loss and low readings. No concerns or side effects to current medication. No change in regimen; continue low sodium diet.

## 2023-06-18 NOTE — Patient Instructions (Signed)
Call [facility] to schedule your mammogram at [phone].  

## 2023-07-01 DIAGNOSIS — D225 Melanocytic nevi of trunk: Secondary | ICD-10-CM | POA: Diagnosis not present

## 2023-07-01 DIAGNOSIS — L298 Other pruritus: Secondary | ICD-10-CM | POA: Diagnosis not present

## 2023-07-01 DIAGNOSIS — L814 Other melanin hyperpigmentation: Secondary | ICD-10-CM | POA: Diagnosis not present

## 2023-07-01 DIAGNOSIS — L821 Other seborrheic keratosis: Secondary | ICD-10-CM | POA: Diagnosis not present

## 2023-07-01 DIAGNOSIS — H01134 Eczematous dermatitis of left upper eyelid: Secondary | ICD-10-CM | POA: Diagnosis not present

## 2023-07-09 ENCOUNTER — Encounter: Payer: 59 | Admitting: Internal Medicine

## 2023-07-19 ENCOUNTER — Encounter: Payer: Self-pay | Admitting: Internal Medicine

## 2023-08-13 ENCOUNTER — Encounter: Payer: Self-pay | Admitting: Internal Medicine

## 2023-09-01 ENCOUNTER — Other Ambulatory Visit: Payer: Self-pay

## 2023-09-01 MED FILL — Tirzepatide (Weight Mngmt) Soln Auto-Injector 7.5 MG/0.5ML: SUBCUTANEOUS | 28 days supply | Qty: 2 | Fill #1 | Status: AC

## 2023-09-19 ENCOUNTER — Other Ambulatory Visit: Payer: Self-pay | Admitting: Internal Medicine

## 2023-09-21 NOTE — Telephone Encounter (Signed)
Requested Prescriptions  Refused Prescriptions Disp Refills   lisinopril (ZESTRIL) 20 MG tablet [Pharmacy Med Name: LISINOPRIL TAB 20MG ] 90 tablet 3    Sig: TAKE 1 TABLET BY MOUTH ONCE A DAY     Cardiovascular:  ACE Inhibitors Passed - 09/19/2023  3:18 AM      Passed - Cr in normal range and within 180 days    Creatinine, Ser  Date Value Ref Range Status  06/18/2023 0.96 0.57 - 1.00 mg/dL Final         Passed - K in normal range and within 180 days    Potassium  Date Value Ref Range Status  06/18/2023 4.2 3.5 - 5.2 mmol/L Final         Passed - Patient is not pregnant      Passed - Last BP in normal range    BP Readings from Last 1 Encounters:  06/18/23 124/76         Passed - Valid encounter within last 6 months    Recent Outpatient Visits           3 months ago Annual physical exam   [place] [facility] at [facility], Nyoka Cowden, MD   8 months ago Benign hypertension   [place] [facility] at [facility], Nyoka Cowden, MD   11 months ago Benign hypertension   [place] [facility] at [facility], Nyoka Cowden, MD   1 year ago Annual physical exam   [place] [facility] at [facility], Nyoka Cowden, MD   1 year ago Benign hypertension   [place] [facility] at [facility], Nyoka Cowden, MD       Future Appointments             In 2 months Judithann Graves Nyoka Cowden, MD [facility] Medicine at [facility], [facility]   In 9 months Reubin Milan, MD [facility] at [facility] [place]             hydrochlorothiazide (HYDRODIURIL) 25 MG tablet [Pharmacy Med Name: HYDROCHLOROT TAB 25MG ] 90 tablet 3    Sig: TAKE 1 TABLET BY MOUTH ONCE A DAY     Cardiovascular: Diuretics - Thiazide Passed - 09/19/2023  3:18 AM      Passed - Cr in normal range and within 180 days     Creatinine, Ser  Date Value Ref Range Status  06/18/2023 0.96 0.57 - 1.00 mg/dL Final         Passed - K in normal range and within 180 days    Potassium  Date Value Ref Range Status  06/18/2023 4.2 3.5 - 5.2 mmol/L Final         Passed - Na in normal range and within 180 days    Sodium  Date Value Ref Range Status  06/18/2023 139 134 - 144 mmol/L Final         Passed - Last BP in normal range    BP Readings from Last 1 Encounters:  06/18/23 124/76         Passed - Valid encounter within last 6 months    Recent Outpatient Visits           3 months ago Annual physical exam   [facility] at [facility], Vernona Rieger  H, MD   8 months ago Benign hypertension   [place] [facility] at [facility], Nyoka Cowden, MD   11 months ago Benign hypertension   [place] [facility] at [facility], Nyoka Cowden, MD   1 year ago Annual physical exam   [facility] at [facility], Nyoka Cowden, MD   1 year ago Benign hypertension    Primary Care & Sports Medicine at [facility], Nyoka Cowden, MD       Future Appointments             In 2 months Judithann Graves, Nyoka Cowden, MD [facility] at [facility], [facility]   In 9 months Judithann Graves, Nyoka Cowden, MD [facility] Primary [facility] at [facility], [facility]

## 2023-09-28 ENCOUNTER — Other Ambulatory Visit: Payer: Self-pay

## 2023-09-28 MED FILL — Tirzepatide (Weight Mngmt) Soln Auto-Injector 7.5 MG/0.5ML: SUBCUTANEOUS | 28 days supply | Qty: 2 | Fill #2 | Status: AC

## 2023-10-31 ENCOUNTER — Other Ambulatory Visit: Payer: Self-pay | Admitting: Internal Medicine

## 2023-10-31 DIAGNOSIS — Z6837 Body mass index (BMI) 37.0-37.9, adult: Secondary | ICD-10-CM

## 2023-11-02 ENCOUNTER — Other Ambulatory Visit: Payer: Self-pay

## 2023-11-02 ENCOUNTER — Other Ambulatory Visit: Payer: Self-pay | Admitting: Internal Medicine

## 2023-11-02 DIAGNOSIS — Z6837 Body mass index (BMI) 37.0-37.9, adult: Secondary | ICD-10-CM

## 2023-11-03 ENCOUNTER — Other Ambulatory Visit: Payer: Self-pay

## 2023-11-03 NOTE — Telephone Encounter (Signed)
Requested medication (s) are due for refill today - yes  Requested medication (s) are on the active medication list -yes  Future visit scheduled -yes  Last refill: 06/08/23 2ml 2RF  Notes to clinic: off protocol- provider review   Requested Prescriptions  Pending Prescriptions Disp Refills   ZEPBOUND 7.5 MG/0.5ML Pen [Pharmacy Med Name: tirzepatide (ZEPBOUND) 7.5 MG/0.5ML Pen] 2 mL 2    Sig: Inject 7.5 mg into the skin once a week.     Off-Protocol Failed - 11/03/2023  8:05 AM      Failed - Medication not assigned to a protocol, review manually.      Passed - Valid encounter within last 12 months    Recent Outpatient Visits           4 months ago Annual physical exam   [place] [facility] at [facility], Nyoka Cowden, MD   9 months ago Benign hypertension   [place] [facility] at [facility], Nyoka Cowden, MD   1 year ago Benign hypertension   [place] [facility] at [facility], Nyoka Cowden, MD   1 year ago Annual physical exam   [facility] at [facility], Nyoka Cowden, MD   1 year ago Benign hypertension   [place] [facility] at [facility], Nyoka Cowden, MD       Future Appointments             In 1 month Judithann Graves Nyoka Cowden, MD [facility] Primary Care & Sports Medicine at [facility], [facility]   In 7 months Judithann Graves Nyoka Cowden, MD [facility] Primary Care & Sports Medicine at [facility], [facility]               Requested Prescriptions  Pending Prescriptions Disp Refills   ZEPBOUND 7.5 MG/0.5ML Pen [Pharmacy Med Name: tirzepatide (ZEPBOUND) 7.5 MG/0.5ML Pen] 2 mL 2    Sig: Inject 7.5 mg into the skin once a week.     Off-Protocol Failed - 11/03/2023  8:05 AM      Failed - Medication not assigned to a protocol, review manually.      Passed - Valid encounter within last 12 months    Recent  Outpatient Visits           4 months ago Annual physical exam   [place] [facility] at [facility], Nyoka Cowden, MD   9 months ago Benign hypertension   [place] [facility] at [facility], Nyoka Cowden, MD   1 year ago Benign hypertension   [place] [facility] at [facility], Nyoka Cowden, MD   1 year ago Annual physical exam   [facility] at [facility], Nyoka Cowden, MD   1 year ago Benign hypertension    Primary Care & Sports Medicine at [facility], Nyoka Cowden, MD       Future Appointments             In 1 month Judithann Graves, Nyoka Cowden, MD [facility] Primary Care & Sports Medicine at [facility], [facility]   In 7 months Judithann Graves, Nyoka Cowden, MD [facility] Primary Care & Sports Medicine at [facility], [facility]

## 2023-11-05 ENCOUNTER — Other Ambulatory Visit: Payer: Self-pay

## 2023-11-06 ENCOUNTER — Other Ambulatory Visit: Payer: Self-pay

## 2023-11-06 MED FILL — Tirzepatide (Weight Mngmt) Soln Auto-Injector 7.5 MG/0.5ML: SUBCUTANEOUS | 28 days supply | Qty: 2 | Fill #0 | Status: AC

## 2023-11-17 ENCOUNTER — Other Ambulatory Visit: Payer: Self-pay | Admitting: General Surgery

## 2023-11-17 DIAGNOSIS — R160 Hepatomegaly, not elsewhere classified: Secondary | ICD-10-CM

## 2023-11-17 DIAGNOSIS — K760 Fatty (change of) liver, not elsewhere classified: Secondary | ICD-10-CM

## 2023-11-17 DIAGNOSIS — K7689 Other specified diseases of liver: Secondary | ICD-10-CM

## 2023-11-18 ENCOUNTER — Ambulatory Visit
Admission: RE | Admit: 2023-11-18 | Discharge: 2023-11-18 | Disposition: A | Discharge status: Home / Self Care | Payer: Commercial Managed Care - PPO | Source: Ambulatory Visit | Attending: Internal Medicine | Admitting: Internal Medicine

## 2023-11-18 DIAGNOSIS — Z1231 Encounter for screening mammogram for malignant neoplasm of breast: Secondary | ICD-10-CM | POA: Diagnosis not present

## 2023-11-26 ENCOUNTER — Other Ambulatory Visit: Payer: Self-pay | Admitting: Internal Medicine

## 2023-11-26 DIAGNOSIS — I10 Essential (primary) hypertension: Secondary | ICD-10-CM

## 2023-11-26 NOTE — Telephone Encounter (Signed)
Requested Prescriptions  Pending Prescriptions Disp Refills   metoprolol succinate (TOPROL-XL) 25 MG 24 hr tablet [Pharmacy Med Name: METOPROLOL TAB 25MG  ER] 90 tablet 1    Sig: TAKE 1 TABLET (25 MG TOTAL) BY MOUTH DAILY.     Cardiovascular:  Beta Blockers Failed - 11/26/2023  1:46 PM      Failed - Last Heart Rate in normal range    Pulse Readings from Last 1 Encounters:  06/18/23 (!) 112         Passed - Last BP in normal range    BP Readings from Last 1 Encounters:  06/18/23 124/76         Passed - Valid encounter within last 6 months    Recent Outpatient Visits           5 months ago Annual physical exam   [facility] Primary [facility] & [facility] Medicine at [facility], Nyoka Cowden, MD   10 months ago Benign hypertension   [place] [facility] at [facility], Nyoka Cowden, MD   1 year ago Benign hypertension   [place] [facility] at [facility], Nyoka Cowden, MD   1 year ago Annual physical exam   [facility] at [facility], Nyoka Cowden, MD   1 year ago Benign hypertension   [place] [facility] at [facility], Nyoka Cowden, MD       Future Appointments             In 2 weeks Judithann Graves, Nyoka Cowden, MD [facility] Primary Care & Sports Medicine at [facility], [facility]   In 6 months Judithann Graves, Nyoka Cowden, MD [facility] Primary Care & Sports Medicine at [facility], [facility]

## 2023-12-09 ENCOUNTER — Ambulatory Visit
Admission: RE | Admit: 2023-12-09 | Discharge: 2023-12-09 | Disposition: A | Discharge status: Home / Self Care | Payer: Commercial Managed Care - PPO | Source: Ambulatory Visit | Attending: General Surgery | Admitting: General Surgery

## 2023-12-09 DIAGNOSIS — R160 Hepatomegaly, not elsewhere classified: Secondary | ICD-10-CM

## 2023-12-09 DIAGNOSIS — K828 Other specified diseases of gallbladder: Secondary | ICD-10-CM | POA: Diagnosis not present

## 2023-12-09 DIAGNOSIS — K760 Fatty (change of) liver, not elsewhere classified: Secondary | ICD-10-CM

## 2023-12-09 DIAGNOSIS — D134 Benign neoplasm of liver: Secondary | ICD-10-CM | POA: Diagnosis not present

## 2023-12-09 DIAGNOSIS — K7689 Other specified diseases of liver: Secondary | ICD-10-CM

## 2023-12-09 MED ORDER — GADOPICLENOL 0.5 MMOL/ML IV SOLN
8.0000 mL | Freq: Once | INTRAVENOUS | Status: AC | PRN
  Administered 2023-12-09: 8 mL via INTRAVENOUS

## 2023-12-16 ENCOUNTER — Ambulatory Visit: Payer: Commercial Managed Care - PPO | Admitting: Internal Medicine

## 2023-12-16 ENCOUNTER — Encounter: Payer: Self-pay | Admitting: Internal Medicine

## 2023-12-16 VITALS — BP 114/68 | HR 94 | Ht 67.5 in | Wt 172.0 lb

## 2023-12-16 DIAGNOSIS — D693 Immune thrombocytopenic purpura: Secondary | ICD-10-CM

## 2023-12-16 DIAGNOSIS — K760 Fatty (change of) liver, not elsewhere classified: Secondary | ICD-10-CM | POA: Diagnosis not present

## 2023-12-16 DIAGNOSIS — I10 Essential (primary) hypertension: Secondary | ICD-10-CM | POA: Diagnosis not present

## 2023-12-16 DIAGNOSIS — D134 Benign neoplasm of liver: Secondary | ICD-10-CM | POA: Diagnosis not present

## 2023-12-16 DIAGNOSIS — E782 Mixed hyperlipidemia: Secondary | ICD-10-CM | POA: Diagnosis not present

## 2023-12-16 NOTE — Progress Notes (Signed)
 Date:  12/16/2023   Name:  Darlene Munoz   DOB:  24-Jan-1981   MRN:  [id number]   Chief Complaint: Hypertension  Hypertension This is a chronic problem. The problem is controlled. Pertinent negatives include no chest pain, headaches, palpitations or shortness of breath.  Fatty Liver - has completely resolved as of last CT this week.  She is very happy with the results. Weight loss - on Mounjaro  - tapering slowly off and trying to establish good exercise habits.  Weight continues to slowly decrease.  Review of Systems  Constitutional:  Negative for fatigue and unexpected weight change.  HENT:  Negative for nosebleeds.   Eyes:  Negative for visual disturbance.  Respiratory:  Negative for cough, chest tightness, shortness of breath and wheezing.   Cardiovascular:  Negative for chest pain, palpitations and leg swelling.  Gastrointestinal:  Negative for abdominal pain, constipation and diarrhea.  Neurological:  Negative for dizziness, weakness, light-headedness and headaches.     Lab Results  Component Value Date   NA 139 06/18/2023   K 4.2 06/18/2023   CO2 21 06/18/2023   GLUCOSE 79 06/18/2023   BUN 17 06/18/2023   CREATININE 0.96 06/18/2023   CALCIUM 9.7 06/18/2023   EGFR 77 06/18/2023   GFRNONAA 79 06/25/2020   Lab Results  Component Value Date   CHOL 201 (H) 06/18/2023   HDL 44 06/18/2023   LDLCALC 137 (H) 06/18/2023   TRIG 110 06/18/2023   CHOLHDL 4.8 (H) 07/04/2022   Lab Results  Component Value Date   TSH 3.840 06/18/2023   Lab Results  Component Value Date   HGBA1C 5.2 06/18/2023   Lab Results  Component Value Date   WBC 6.5 06/18/2023   HGB 14.1 06/18/2023   HCT 43.9 06/18/2023   MCV 90 06/18/2023   PLT 308 06/18/2023   Lab Results  Component Value Date   ALT 16 06/18/2023   AST 16 06/18/2023   ALKPHOS 95 06/18/2023   BILITOT 0.4 06/18/2023   No results found for: MARIEN BOLLS, VD25OH   Patient Active Problem List   Diagnosis  Date Noted   Adenoma of liver 08/06/2021   Hepatic steatosis 08/06/2021   Migraine with aura and without status migrainosus, not intractable 06/25/2020   Parasomnia 07/29/2017   Benign hypertension 09/30/2015   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 No pertinent surgical history.  Social History   Tobacco Use   Smoking status: Never   Smokeless tobacco: Never  Vaping Use   Vaping status: Never Used  Substance Use Topics   Alcohol use: No    Alcohol/week: 0.0 standard drinks of alcohol     Medication list has been reviewed and updated.  Current Meds  Medication Sig   cetirizine (ZYRTEC) 10 MG tablet Take 10 mg by mouth as needed.    lisinopril  (ZESTRIL ) 10 MG tablet Take 1 tablet (10 mg total) by mouth daily.   metoprolol  succinate (TOPROL -XL) 25 MG 24 hr tablet TAKE 1 TABLET (25 MG TOTAL) BY MOUTH DAILY.   tirzepatide  (ZEPBOUND ) 7.5 MG/0.5ML Pen Inject 7.5 mg into the skin once a week.       12/16/2023    2:12 PM 06/18/2023    8:10 AM 10/08/2022    1:23 PM 07/04/2022    8:00 AM  GAD 7 : Generalized Anxiety Score  Nervous, Anxious, on Edge  0 0 0 0  Control/stop worrying 0 0 0 0  Worry too much - different things 0 0 0 0  Trouble relaxing 0 0 0 0  Restless 0 0 0 0  Easily annoyed or irritable 0 0 0 0  Afraid - awful might happen 0 0 0 0  Total GAD 7 Score 0 0 0 0  Anxiety Difficulty Not difficult at all Not difficult at all Not difficult at all Not difficult at all       12/16/2023    2:12 PM 06/18/2023    8:10 AM 10/08/2022    1:23 PM  Depression screen PHQ 2/9  Decreased Interest 0 0 0  Down, Depressed, Hopeless 0 0 0  PHQ - 2 Score 0 0 0  Altered sleeping 0 0 0  Tired, decreased energy 0 0 0  Change in appetite 0 0 0  Feeling bad or failure about yourself  0 0 0  Trouble concentrating 0 0 0  Moving slowly or  fidgety/restless 0 0 0  Suicidal thoughts 0 0 0  PHQ-9 Score 0 0 0  Difficult doing work/chores Not difficult at all Not difficult at all Not difficult at all    BP Readings from Last 3 Encounters:  12/16/23 114/68  06/18/23 124/76  01/14/23 118/78    Physical Exam Vitals and nursing note reviewed.  Constitutional:      General: She is not in acute distress.    Appearance: Normal appearance. She is well-developed.  HENT:     Head: Normocephalic and atraumatic.  Cardiovascular:     Rate and Rhythm: Normal rate and regular rhythm.  Pulmonary:     Effort: Pulmonary effort is normal. No respiratory distress.     Breath sounds: No wheezing or rhonchi.  Musculoskeletal:     Cervical back: Normal range of motion.  Skin:    General: Skin is warm and dry.     Findings: No rash.  Neurological:     General: No focal deficit present.     Mental Status: She is alert and oriented to person, place, and time.  Psychiatric:        Mood and Affect: Mood normal.        Behavior: Behavior normal.     Wt Readings from Last 3 Encounters:  12/16/23 172 lb (78 kg)  06/18/23 182 lb (82.6 kg)  01/14/23 198 lb (89.8 kg)    BP 114/68   Pulse 94   Ht 5' 7.5 (1.715 m)   Wt 172 lb (78 kg)   SpO2 99%   BMI 26.54 kg/m   Assessment and Plan:  Problem List Items Addressed This Visit       Unprioritized   Benign hypertension - Primary (Chronic)   Controlled BP with normal exam. Current regimen is metoprolol  and lisinopril . Will continue same medications; encourage continued reduced sodium diet.       Relevant Orders   CBC with Differential/Platelet   Immune thrombocytopenia (HCC) (Chronic)   Continue to monitor regularly      Hyperlipidemia, mixed (Chronic)   Cholesterol controlled with diet changes and weight loss      Relevant Orders   Lipid panel   Adenoma of liver (Chronic)   Relevant Orders   Comprehensive metabolic panel   Hepatic steatosis   Doing great with  weight loss on Mounjaro  Recent CT showed resolution of steatosis       No follow-ups on file.    Leita HILARIO Adie, MD  [facility] Primary Care and Sports Medicine [place]

## 2023-12-16 NOTE — Assessment & Plan Note (Signed)
 Cholesterol controlled with diet changes and weight loss

## 2023-12-16 NOTE — Assessment & Plan Note (Signed)
 Controlled BP with normal exam. Current regimen is metoprolol and lisinopril. Will continue same medications; encourage continued reduced sodium diet.

## 2023-12-16 NOTE — Assessment & Plan Note (Signed)
 Continue to monitor regularly.

## 2023-12-16 NOTE — Assessment & Plan Note (Signed)
Doing great with weight loss on Mounjaro Recent CT showed resolution of steatosis

## 2023-12-17 ENCOUNTER — Encounter: Payer: Self-pay | Admitting: Internal Medicine

## 2023-12-17 LAB — CBC WITH DIFFERENTIAL/PLATELET
Basophils Absolute: 0.1 10*3/uL (ref 0.0–0.2)
Basos: 1 %
EOS (ABSOLUTE): 0.1 10*3/uL (ref 0.0–0.4)
Eos: 1 %
Hematocrit: 38.7 % (ref 34.0–46.6)
Hemoglobin: 13.3 g/dL (ref 11.1–15.9)
Immature Grans (Abs): 0 10*3/uL (ref 0.0–0.1)
Immature Granulocytes: 0 %
Lymphocytes Absolute: 2.5 10*3/uL (ref 0.7–3.1)
Lymphs: 34 %
MCH: 30.5 pg (ref 26.6–33.0)
MCHC: 34.4 g/dL (ref 31.5–35.7)
MCV: 89 fL (ref 79–97)
Monocytes Absolute: 0.4 10*3/uL (ref 0.1–0.9)
Monocytes: 6 %
Neutrophils Absolute: 4.2 10*3/uL (ref 1.4–7.0)
Neutrophils: 58 %
Platelets: 181 10*3/uL (ref 150–450)
RBC: 4.36 x10E6/uL (ref 3.77–5.28)
RDW: 11.9 % (ref 11.7–15.4)
WBC: 7.2 10*3/uL (ref 3.4–10.8)

## 2023-12-17 LAB — COMPREHENSIVE METABOLIC PANEL
ALT: 14 [IU]/L (ref 0–32)
AST: 13 [IU]/L (ref 0–40)
Albumin: 4.5 g/dL (ref 3.9–4.9)
Alkaline Phosphatase: 71 [IU]/L (ref 44–121)
BUN/Creatinine Ratio: 13 (ref 9–23)
BUN: 11 mg/dL (ref 6–24)
Bilirubin Total: 0.4 mg/dL (ref 0.0–1.2)
CO2: 24 mmol/L (ref 20–29)
Calcium: 9.5 mg/dL (ref 8.7–10.2)
Chloride: 105 mmol/L (ref 96–106)
Creatinine, Ser: 0.87 mg/dL (ref 0.57–1.00)
Globulin, Total: 2.1 g/dL (ref 1.5–4.5)
Glucose: 74 mg/dL (ref 70–99)
Potassium: 4.2 mmol/L (ref 3.5–5.2)
Sodium: 143 mmol/L (ref 134–144)
Total Protein: 6.6 g/dL (ref 6.0–8.5)
eGFR: 86 mL/min/{1.73_m2} (ref >59)

## 2023-12-17 LAB — LIPID PANEL
Chol/HDL Ratio: 3.7 {ratio} (ref 0.0–4.4)
Cholesterol, Total: 185 mg/dL (ref 100–199)
HDL: 50 mg/dL (ref >39)
LDL Chol Calc (NIH): 119 mg/dL — ABNORMAL HIGH (ref 0–99)
Triglycerides: 86 mg/dL (ref 0–149)
VLDL Cholesterol Cal: 16 mg/dL (ref 5–40)

## 2024-01-19 ENCOUNTER — Other Ambulatory Visit: Payer: Self-pay

## 2024-01-19 ENCOUNTER — Other Ambulatory Visit: Payer: Self-pay | Admitting: Internal Medicine

## 2024-01-19 DIAGNOSIS — I10 Essential (primary) hypertension: Secondary | ICD-10-CM

## 2024-01-19 MED FILL — Tirzepatide (Weight Mngmt) Soln Auto-Injector 7.5 MG/0.5ML: SUBCUTANEOUS | 28 days supply | Qty: 2 | Fill #1 | Status: AC

## 2024-01-20 DIAGNOSIS — K760 Fatty (change of) liver, not elsewhere classified: Secondary | ICD-10-CM | POA: Diagnosis not present

## 2024-01-20 DIAGNOSIS — R160 Hepatomegaly, not elsewhere classified: Secondary | ICD-10-CM | POA: Diagnosis not present

## 2024-03-10 ENCOUNTER — Other Ambulatory Visit: Payer: Self-pay | Admitting: Internal Medicine

## 2024-03-10 DIAGNOSIS — I10 Essential (primary) hypertension: Secondary | ICD-10-CM

## 2024-03-11 ENCOUNTER — Other Ambulatory Visit (HOSPITAL_COMMUNITY): Payer: Self-pay

## 2024-03-14 ENCOUNTER — Other Ambulatory Visit (HOSPITAL_COMMUNITY): Payer: Self-pay

## 2024-03-14 MED ORDER — LISINOPRIL 10 MG PO TABS
10.0000 mg | ORAL_TABLET | Freq: Every day | ORAL | 0 refills | Status: DC
  Filled 2024-03-14: qty 90, 90d supply, fill #0

## 2024-03-14 NOTE — Telephone Encounter (Signed)
 Requested Prescriptions  Pending Prescriptions Disp Refills   lisinopril  (ZESTRIL ) 10 MG tablet 90 tablet 0    Sig: Take 1 tablet (10 mg total) by mouth daily.     Cardiovascular:  ACE Inhibitors Passed - 03/14/2024 12:32 PM      Passed - Cr in normal range and within 180 days    Creatinine, Ser  Date Value Ref Range Status  12/16/2023 0.87 0.57 - 1.00 mg/dL Final         Passed - K in normal range and within 180 days    Potassium  Date Value Ref Range Status  12/16/2023 4.2 3.5 - 5.2 mmol/L Final         Passed - Patient is not pregnant      Passed - Last BP in normal range    BP Readings from Last 1 Encounters:  12/16/23 114/68         Passed - Valid encounter within last 6 months    Recent Outpatient Visits           2 months ago Benign hypertension   [place] [facility] at [facility], Chales [place], MD       Future Appointments             In 3 months Gala Jubilee, Chales [place], MD [facility] Primary Care & Sports Medicine at [facility], [facility]

## 2024-03-15 ENCOUNTER — Other Ambulatory Visit: Payer: Self-pay

## 2024-04-06 ENCOUNTER — Other Ambulatory Visit: Payer: Self-pay

## 2024-04-06 MED FILL — Tirzepatide (Weight Mngmt) Soln Auto-Injector 7.5 MG/0.5ML: SUBCUTANEOUS | 28 days supply | Qty: 2 | Fill #2 | Status: AC

## 2024-05-04 DIAGNOSIS — K828 Other specified diseases of gallbladder: Secondary | ICD-10-CM | POA: Diagnosis not present

## 2024-05-04 DIAGNOSIS — K859 Acute pancreatitis without necrosis or infection, unspecified: Secondary | ICD-10-CM | POA: Diagnosis not present

## 2024-05-04 DIAGNOSIS — K858 Other acute pancreatitis without necrosis or infection: Secondary | ICD-10-CM | POA: Diagnosis not present

## 2024-05-04 DIAGNOSIS — R109 Unspecified abdominal pain: Secondary | ICD-10-CM | POA: Diagnosis not present

## 2024-05-04 DIAGNOSIS — R1013 Epigastric pain: Secondary | ICD-10-CM | POA: Diagnosis not present

## 2024-05-05 DIAGNOSIS — K859 Acute pancreatitis without necrosis or infection, unspecified: Secondary | ICD-10-CM | POA: Diagnosis not present

## 2024-05-06 ENCOUNTER — Telehealth: Payer: Self-pay

## 2024-05-06 ENCOUNTER — Other Ambulatory Visit: Payer: Self-pay

## 2024-05-06 ENCOUNTER — Ambulatory Visit: Admitting: Internal Medicine

## 2024-05-06 VITALS — BP 114/78 | HR 85 | Ht 67.5 in | Wt 181.4 lb

## 2024-05-06 DIAGNOSIS — K858 Other acute pancreatitis without necrosis or infection: Secondary | ICD-10-CM

## 2024-05-06 DIAGNOSIS — Z6830 Body mass index (BMI) 30.0-30.9, adult: Secondary | ICD-10-CM | POA: Diagnosis not present

## 2024-05-06 DIAGNOSIS — K859 Acute pancreatitis without necrosis or infection, unspecified: Secondary | ICD-10-CM | POA: Insufficient documentation

## 2024-05-06 MED ORDER — TIRZEPATIDE-WEIGHT MANAGEMENT 5 MG/0.5ML ~~LOC~~ SOAJ
5.0000 mg | SUBCUTANEOUS | 0 refills | Status: DC
  Filled 2024-05-06 – 2024-06-27 (×2): qty 2, 28d supply, fill #0

## 2024-05-06 NOTE — Transitions of Care (Post Inpatient/ED Visit) (Signed)
   05/06/2024  Name: Darlene Munoz MRN: [id number] DOB: 09/07/1981  Today's TOC FU Call Status: Today's TOC FU Call Status:: Successful TOC FU Call Completed TOC FU Call Complete Date: 05/06/24 Patient's Name and Date of Birth confirmed.  Transition Care Management Follow-up Telephone Call Date of Discharge: 05/04/24 Discharge Facility: Other ([facility]) Name of Other ([facility]) Discharge Facility: [facility] Type of Discharge: Emergency Department Reason for ED Visit: Other: How have you been since you were released from the hospital?: Better Any questions or concerns?: No  Items Reviewed: Did you receive and understand the discharge instructions provided?: No Medications obtained,verified, and reconciled?: Yes (Medications Reviewed) Any new allergies since your discharge?: No Dietary orders reviewed?: NA Do you have support at home?: Yes  Medications Reviewed Today: Medications Reviewed Today   Medications were not reviewed in this encounter     Home Care and Equipment/Supplies: Were Home Health Services Ordered?: NA Any new equipment or medical supplies ordered?: NA  Functional Questionnaire: Do you need assistance with bathing/showering or dressing?: No Do you need assistance with meal preparation?: No Do you need assistance with eating?: No Do you have difficulty maintaining continence: No Do you need assistance with getting out of bed/getting out of a chair/moving?: No Do you have difficulty managing or taking your medications?: No  Follow up appointments reviewed: PCP Follow-up appointment confirmed?: Yes Date of PCP follow-up appointment?: 05/06/24 Follow-up Provider: [facility] Follow-up appointment confirmed?: NA Do you need transportation to your follow-up appointment?: No Do you understand care options if your condition(s) worsen?: Yes-patient verbalized understanding    Michaela Broski [place]  Primary Care & Sports  Medicine at [facility] CMA, AAMA [address]  [place] [address] Office [phone]  Fax: [phone]

## 2024-05-06 NOTE — Assessment & Plan Note (Signed)
 Could be due to Zepbound  but she has been on this for 2 years with increased dosing interval. Suspect this is due to a passed gall stone. Will recheck labs; remain off Zepbound  for 4 more weeks If she desires to resume Zepbound , would start lower dose 5 mg

## 2024-05-06 NOTE — Progress Notes (Signed)
 Date:  05/06/2024   Name:  Darlene Munoz   DOB:  [birth year]   MRN:  [id number]   Chief Complaint: Hospitalization Follow-up (Patient presents today for a recent ER visit she had on 05/04/24. She had abdominal pain and it is better now. No concerns. Needs additional labs.)  Abdominal Pain This is a new problem. The current episode started in the past 7 days. The problem has been resolved. The pain is located in the generalized abdominal region. The pain is severe. The quality of the pain is cramping and colicky. The abdominal pain does not radiate. Pertinent negatives include no diarrhea, fever, nausea or vomiting.  Seen in ED at [facility] - lipase very high, CT and US  showed tiny gall stone, otherwise normal.  CT abdomen: IMPRESSION: 1.  Acute interstitial edematous pancreatitis. No evidence of pancreatic necrosis or peripancreatic collection. 2.  Mildly distended gallbladder with mild diffuse gallbladder wall edema. Possible punctate gallstones and/or gallbladder polyps. Consider right upper quadrant ultrasound to further evaluate. No biliary dilatation.    US  bile duct: Tiny echogenic focus within the gallbladder neck may represent a gallstone without secondary signs of cholecystitis.  Additional 0.7 cm echogenic focus along the inferior gallbladder fundus is favored to represent a gallbladder polyp with extremely low risk morphology. No follow-up is indicated.    Review of Systems  Constitutional:  Negative for chills, fatigue and fever.  Respiratory:  Negative for chest tightness and shortness of breath.   Cardiovascular:  Negative for chest pain.  Gastrointestinal:  Negative for abdominal pain, diarrhea, nausea and vomiting.  Psychiatric/Behavioral:  Negative for sleep disturbance.      Lab Results  Component Value Date   NA 143 12/16/2023   K 4.2 12/16/2023   CO2 24 12/16/2023   GLUCOSE 74 12/16/2023   BUN 11 12/16/2023   CREATININE 0.87 12/16/2023   CALCIUM 9.5 12/16/2023    EGFR 86 12/16/2023   GFRNONAA 79 06/25/2020   Lab Results  Component Value Date   CHOL 185 12/16/2023   HDL 50 12/16/2023   LDLCALC 119 (H) 12/16/2023   TRIG 86 12/16/2023   CHOLHDL 3.7 12/16/2023   Lab Results  Component Value Date   TSH 3.840 06/18/2023   Lab Results  Component Value Date   HGBA1C 5.2 06/18/2023   Lab Results  Component Value Date   WBC 7.2 12/16/2023   HGB 13.3 12/16/2023   HCT 38.7 12/16/2023   MCV 89 12/16/2023   PLT 181 12/16/2023   Lab Results  Component Value Date   ALT 14 12/16/2023   AST 13 12/16/2023   ALKPHOS 71 12/16/2023   BILITOT 0.4 12/16/2023   No results found for: MARIEN BOLLS, VD25OH   Patient Active Problem List   Diagnosis Date Noted   Acute pancreatitis 05/06/2024   Adenoma of liver 08/06/2021   Hepatic steatosis 08/06/2021   Migraine with aura and without status migrainosus, not intractable 06/25/2020   Parasomnia 07/29/2017   Benign hypertension 09/30/2015   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 No pertinent surgical history.  Social History   Tobacco Use   Smoking status: Never   Smokeless tobacco: Never  Vaping Use   Vaping status: Never Used  Substance Use Topics   Alcohol use: No    Alcohol/week: 0.0 standard drinks of alcohol     Medication list has been  reviewed and updated.  Current Meds  Medication Sig   cetirizine (ZYRTEC) 10 MG tablet Take 10 mg by mouth as needed.    lisinopril  (ZESTRIL ) 10 MG tablet Take 1 tablet (10 mg total) by mouth daily.   metoprolol  succinate (TOPROL -XL) 25 MG 24 hr tablet TAKE 1 TABLET (25 MG TOTAL) BY MOUTH DAILY.   tirzepatide  5 MG/0.5ML injection vial Inject 5 mg into the skin once a week.   [DISCONTINUED] tirzepatide  (ZEPBOUND ) 7.5 MG/0.5ML Pen Inject 7.5 mg into the skin once a week.       12/16/2023    2:12  PM 06/18/2023    8:10 AM 10/08/2022    1:23 PM 07/04/2022    8:00 AM  GAD 7 : Generalized Anxiety Score  Nervous, Anxious, on Edge 0 0 0 0  Control/stop worrying 0 0 0 0  Worry too much - different things 0 0 0 0  Trouble relaxing 0 0 0 0  Restless 0 0 0 0  Easily annoyed or irritable 0 0 0 0  Afraid - awful might happen 0 0 0 0  Total GAD 7 Score 0 0 0 0  Anxiety Difficulty Not difficult at all Not difficult at all Not difficult at all Not difficult at all       05/06/2024   11:25 AM 12/16/2023    2:12 PM 06/18/2023    8:10 AM  Depression screen PHQ 2/9  Decreased Interest 0 0 0  Down, Depressed, Hopeless 0 0 0  PHQ - 2 Score 0 0 0  Altered sleeping  0 0  Tired, decreased energy  0 0  Change in appetite  0 0  Feeling bad or failure about yourself   0 0  Trouble concentrating  0 0  Moving slowly or fidgety/restless  0 0  Suicidal thoughts  0 0  PHQ-9 Score  0 0  Difficult doing work/chores  Not difficult at all Not difficult at all    BP Readings from Last 3 Encounters:  05/06/24 114/78  12/16/23 114/68  06/18/23 124/76    Physical Exam Vitals and nursing note reviewed.  Constitutional:      General: She is not in acute distress.    Appearance: Normal appearance. She is well-developed.  HENT:     Head: Normocephalic and atraumatic.   Cardiovascular:     Rate and Rhythm: Normal rate and regular rhythm.  Pulmonary:     Effort: Pulmonary effort is normal. No respiratory distress.     Breath sounds: No wheezing or rhonchi.  Abdominal:     General: Abdomen is flat. Bowel sounds are normal.     Palpations: Abdomen is soft.     Tenderness: There is no abdominal tenderness. There is no guarding or rebound.   Musculoskeletal:     Cervical back: Normal range of motion.  Lymphadenopathy:     Cervical: No cervical adenopathy.   Skin:    General: Skin is warm and dry.     Findings: No rash.   Neurological:     Mental Status: She is alert and oriented to person, place,  and time.   Psychiatric:        Mood and Affect: Mood normal.        Behavior: Behavior normal.     Wt Readings from Last 3 Encounters:  05/06/24 181 lb 6.4 oz (82.3 kg)  12/16/23 172 lb (78 kg)  06/18/23 182 lb (82.6 kg)    BP 114/78   Pulse 85  Ht 5' 7.5 (1.715 m)   Wt 181 lb 6.4 oz (82.3 kg)   SpO2 (!) 10%   BMI 27.99 kg/m   Assessment and Plan:  Problem List Items Addressed This Visit       Unprioritized   Acute pancreatitis - Primary   Could be due to Zepbound  but she has been on this for 2 years with increased dosing interval. Suspect this is due to a passed gall stone. Will recheck labs; remain off Zepbound  for 4 more weeks If she desires to resume Zepbound , would start lower dose 5 mg      Relevant Orders   Lipase   Comprehensive metabolic panel with GFR   Lipase   Other Visit Diagnoses       BMI 30.0-30.9,adult       Relevant Medications   tirzepatide  5 MG/0.5ML injection vial       No follow-ups on file.    Leita HILARIO Adie, MD [facility] Primary [facility] [place]

## 2024-05-07 LAB — COMPREHENSIVE METABOLIC PANEL WITH GFR
ALT: 63 IU/L — ABNORMAL HIGH (ref 0–32)
AST: 27 IU/L (ref 0–40)
Albumin: 4.4 g/dL (ref 3.9–4.9)
Alkaline Phosphatase: 88 IU/L (ref 44–121)
BUN/Creatinine Ratio: 10 (ref 9–23)
BUN: 8 mg/dL (ref 6–24)
Bilirubin Total: 0.4 mg/dL (ref 0.0–1.2)
CO2: 23 mmol/L (ref 20–29)
Calcium: 9.3 mg/dL (ref 8.7–10.2)
Chloride: 104 mmol/L (ref 96–106)
Creatinine, Ser: 0.82 mg/dL (ref 0.57–1.00)
Globulin, Total: 2.2 g/dL (ref 1.5–4.5)
Glucose: 79 mg/dL (ref 70–99)
Potassium: 4.5 mmol/L (ref 3.5–5.2)
Sodium: 141 mmol/L (ref 134–144)
Total Protein: 6.6 g/dL (ref 6.0–8.5)
eGFR: 93 mL/min/{1.73_m2} (ref >59)

## 2024-05-07 LAB — LIPASE: Lipase: 40 U/L (ref 14–72)

## 2024-05-08 ENCOUNTER — Ambulatory Visit: Payer: Self-pay | Admitting: Internal Medicine

## 2024-06-20 ENCOUNTER — Encounter: Payer: Self-pay | Admitting: Internal Medicine

## 2024-06-20 ENCOUNTER — Ambulatory Visit (INDEPENDENT_AMBULATORY_CARE_PROVIDER_SITE_OTHER): Payer: Self-pay | Admitting: Internal Medicine

## 2024-06-20 ENCOUNTER — Other Ambulatory Visit: Payer: Self-pay | Admitting: Pediatrics

## 2024-06-20 ENCOUNTER — Other Ambulatory Visit: Payer: Self-pay

## 2024-06-20 ENCOUNTER — Other Ambulatory Visit

## 2024-06-20 VITALS — BP 110/68 | HR 79 | Ht 67.5 in | Wt 174.0 lb

## 2024-06-20 DIAGNOSIS — K760 Fatty (change of) liver, not elsewhere classified: Secondary | ICD-10-CM

## 2024-06-20 DIAGNOSIS — E782 Mixed hyperlipidemia: Secondary | ICD-10-CM | POA: Diagnosis not present

## 2024-06-20 DIAGNOSIS — Z0000 Encounter for general adult medical examination without abnormal findings: Secondary | ICD-10-CM

## 2024-06-20 DIAGNOSIS — D134 Benign neoplasm of liver: Secondary | ICD-10-CM | POA: Diagnosis not present

## 2024-06-20 DIAGNOSIS — Z1231 Encounter for screening mammogram for malignant neoplasm of breast: Secondary | ICD-10-CM | POA: Diagnosis not present

## 2024-06-20 DIAGNOSIS — I10 Essential (primary) hypertension: Secondary | ICD-10-CM

## 2024-06-20 LAB — URINALYSIS, ROUTINE W REFLEX MICROSCOPIC
Bilirubin, UA: NEGATIVE
Glucose, UA: NEGATIVE
Ketones, UA: NEGATIVE
Nitrite, UA: NEGATIVE
Protein,UA: NEGATIVE
RBC, UA: NEGATIVE
Specific Gravity, UA: 1.02 (ref 1.005–1.030)
Urobilinogen, Ur: 0.2 mg/dL (ref 0.2–1.0)
pH, UA: 6 (ref 5.0–7.5)

## 2024-06-20 LAB — MICROSCOPIC EXAMINATION
Bacteria, UA: NONE SEEN
RBC, Urine: NONE SEEN /HPF (ref 0–2)

## 2024-06-20 MED ORDER — LISINOPRIL 10 MG PO TABS
10.0000 mg | ORAL_TABLET | Freq: Every day | ORAL | 1 refills | Status: AC
  Filled 2024-06-20: qty 90, 90d supply, fill #0
  Filled 2024-12-08: qty 90, 90d supply, fill #1

## 2024-06-20 MED ORDER — METOPROLOL SUCCINATE ER 25 MG PO TB24
25.0000 mg | ORAL_TABLET | Freq: Every day | ORAL | 1 refills | Status: DC
  Filled 2024-06-20: qty 90, 90d supply, fill #0

## 2024-06-20 NOTE — Assessment & Plan Note (Signed)
 Resolved with weight loss.

## 2024-06-20 NOTE — Progress Notes (Signed)
 Placed future orders as part of annual  Hadassah SHAUNNA Nett, MD

## 2024-06-20 NOTE — Assessment & Plan Note (Signed)
 Blood pressure is well controlled on lisinopril  and metoprolol . No medication side effects noted. Plan to continue current medications.

## 2024-06-20 NOTE — Assessment & Plan Note (Addendum)
 MRI done 12/2023: Resolution of hepatic steatosis since prior exam. Decreased size and number of hepatic adenomas since prior study. Liver specialist recommended repeat MRI in 2 years

## 2024-06-20 NOTE — Assessment & Plan Note (Signed)
Managed with diet and weight loss

## 2024-06-20 NOTE — Progress Notes (Signed)
 Date:  06/20/2024   Name:  Darlene Munoz   DOB:  12-24-1980   MRN:  [id number]   Chief Complaint: Annual Exam Darlene Munoz is a 43 y.o. female who presents today for her Complete Annual Exam. She feels well. She reports exercising. She reports she is sleeping well. Breast complaints - none.  Health Maintenance  Topic Date Due   Hepatitis B Vaccine (1 of 3 - 19+ 3-dose series) Never done   HPV Vaccine (1 - 3-dose SCDM series) Never done   Flu Shot  06/10/2024   DTaP/Tdap/Td vaccine (3 - Td or Tdap) 02/19/2028   Pap with HPV screening  06/17/2028   COVID-19 Vaccine  Completed   Hepatitis C Screening  Completed   HIV Screening  Completed   Meningitis B Vaccine  Aged Out    Hypertension Pertinent negatives include no chest pain, headaches, palpitations or shortness of breath.  Hyperlipidemia Pertinent negatives include no chest pain, myalgias or shortness of breath.  She is still on Mounjaro  5 mg dosing about every 3 weeks and maintaining weight loss.  No further abdominal pain noted.  Review of Systems  Constitutional:  Negative for fatigue and unexpected weight change.  HENT:  Negative for trouble swallowing.   Eyes:  Negative for visual disturbance.  Respiratory:  Negative for cough, chest tightness, shortness of breath and wheezing.   Cardiovascular:  Negative for chest pain, palpitations and leg swelling.  Gastrointestinal:  Negative for abdominal pain, constipation and diarrhea.  Musculoskeletal:  Negative for arthralgias and myalgias.  Neurological:  Negative for dizziness, weakness, light-headedness and headaches.     Lab Results  Component Value Date   NA 141 05/06/2024   K 4.5 05/06/2024   CO2 23 05/06/2024   GLUCOSE 79 05/06/2024   BUN 8 05/06/2024   CREATININE 0.82 05/06/2024   CALCIUM 9.3 05/06/2024   EGFR 93 05/06/2024   GFRNONAA 79 06/25/2020   Lab Results  Component Value Date   CHOL 185 12/16/2023   HDL 50 12/16/2023   LDLCALC 119 (H)  12/16/2023   TRIG 86 12/16/2023   CHOLHDL 3.7 12/16/2023   Lab Results  Component Value Date   TSH 3.840 06/18/2023   Lab Results  Component Value Date   HGBA1C 5.2 06/18/2023   Lab Results  Component Value Date   WBC 7.2 12/16/2023   HGB 13.3 12/16/2023   HCT 38.7 12/16/2023   MCV 89 12/16/2023   PLT 181 12/16/2023   Lab Results  Component Value Date   ALT 63 (H) 05/06/2024   AST 27 05/06/2024   ALKPHOS 88 05/06/2024   BILITOT 0.4 05/06/2024   No results found for: MARIEN BOLLS, VD25OH   Patient Active Problem List   Diagnosis Date Noted   Acute pancreatitis 05/06/2024   Adenoma of liver 08/06/2021   Hepatic steatosis 08/06/2021   Migraine with aura and without status migrainosus, not intractable 06/25/2020   Parasomnia 07/29/2017   Benign hypertension 09/30/2015   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 No pertinent surgical history.  Social History   Tobacco Use   Smoking status: Never   Smokeless tobacco: Never  Vaping Use   Vaping status: Never Used  Substance Use Topics   Alcohol use: Yes    Alcohol/week: 1.0 standard drink of alcohol    Types: 1 Glasses of wine per week  Comment: Social drinker, usually 1-2 glasses of wine a month   Drug use: Never     Medication list has been reviewed and updated.  Current Meds  Medication Sig   cetirizine (ZYRTEC) 10 MG tablet Take 10 mg by mouth as needed.    tirzepatide  5 MG/0.5ML injection vial Inject 5 mg into the skin once a week.   [DISCONTINUED] lisinopril  (ZESTRIL ) 10 MG tablet Take 1 tablet (10 mg total) by mouth daily.   [DISCONTINUED] metoprolol  succinate (TOPROL -XL) 25 MG 24 hr tablet TAKE 1 TABLET (25 MG TOTAL) BY MOUTH DAILY.       06/20/2024    8:12 AM 12/16/2023    2:12 PM 06/18/2023    8:10 AM 10/08/2022    1:23 PM  GAD 7 : Generalized  Anxiety Score  Nervous, Anxious, on Edge 0 0 0 0  Control/stop worrying 0 0 0 0  Worry too much - different things 0 0 0 0  Trouble relaxing 0 0 0 0  Restless 0 0 0 0  Easily annoyed or irritable 0 0 0 0  Afraid - awful might happen 0 0 0 0  Total GAD 7 Score 0 0 0 0  Anxiety Difficulty Not difficult at all Not difficult at all Not difficult at all Not difficult at all       06/20/2024    8:12 AM 05/06/2024   11:25 AM 12/16/2023    2:12 PM  Depression screen PHQ 2/9  Decreased Interest 0 0 0  Down, Depressed, Hopeless 0 0 0  PHQ - 2 Score 0 0 0  Altered sleeping 0  0  Tired, decreased energy 0  0  Change in appetite 0  0  Feeling bad or failure about yourself  0  0  Trouble concentrating 0  0  Moving slowly or fidgety/restless 0  0  Suicidal thoughts 0  0  PHQ-9 Score 0  0  Difficult doing work/chores Not difficult at all  Not difficult at all    BP Readings from Last 3 Encounters:  06/20/24 110/68  05/06/24 114/78  12/16/23 114/68    Physical Exam Vitals and nursing note reviewed.  Constitutional:      General: She is not in acute distress.    Appearance: She is well-developed.  HENT:     Head: Normocephalic and atraumatic.     Nose:     Right Sinus: No maxillary sinus tenderness.     Left Sinus: No maxillary sinus tenderness.  Eyes:     General: No scleral icterus.       Right eye: No discharge.        Left eye: No discharge.     Conjunctiva/sclera: Conjunctivae normal.  Neck:     Thyroid : No thyromegaly.     Vascular: No carotid bruit.  Cardiovascular:     Rate and Rhythm: Normal rate and regular rhythm.     Pulses: Normal pulses.     Heart sounds: Normal heart sounds.  Pulmonary:     Effort: Pulmonary effort is normal. No respiratory distress.     Breath sounds: No wheezing.  Abdominal:     General: Bowel sounds are normal.     Palpations: Abdomen is soft.     Tenderness: There is no abdominal tenderness.  Musculoskeletal:     Cervical back: Normal  range of motion. No erythema.     Right lower leg: No edema.     Left lower leg: No edema.  Lymphadenopathy:  Cervical: No cervical adenopathy.  Skin:    General: Skin is warm and dry.     Capillary Refill: Capillary refill takes less than 2 seconds.     Findings: No rash.  Neurological:     Mental Status: She is alert and oriented to person, place, and time.     Cranial Nerves: No cranial nerve deficit.     Sensory: No sensory deficit.     Deep Tendon Reflexes: Reflexes are normal and symmetric.  Psychiatric:        Attention and Perception: Attention normal.        Mood and Affect: Mood normal.     Wt Readings from Last 3 Encounters:  06/20/24 174 lb (78.9 kg)  05/06/24 181 lb 6.4 oz (82.3 kg)  12/16/23 172 lb (78 kg)    BP 110/68   Pulse 79   Ht 5' 7.5 (1.715 m)   Wt 174 lb (78.9 kg)   LMP 05/30/2024 (Exact Date)   SpO2 100%   BMI 26.85 kg/m   Assessment and Plan:  Problem List Items Addressed This Visit       Unprioritized   Adenoma of liver (Chronic)   MRI done 12/2023: Resolution of hepatic steatosis since prior exam. Decreased size and number of hepatic adenomas since prior study. Liver specialist recommended repeat MRI in 2 years      Relevant Orders   Comprehensive metabolic panel with GFR   Benign hypertension (Chronic)   Blood pressure is well controlled on lisinopril  and metoprolol . No medication side effects noted. Plan to continue current medications.       Relevant Medications   lisinopril  (ZESTRIL ) 10 MG tablet   metoprolol  succinate (TOPROL -XL) 25 MG 24 hr tablet   Other Relevant Orders   CBC with Differential/Platelet   Comprehensive metabolic panel with GFR   TSH   Urinalysis, Routine w reflex microscopic   Hepatic steatosis   Resolved with weight loss.      Hyperlipidemia, mixed (Chronic)   Managed with diet and weight loss.      Relevant Medications   lisinopril  (ZESTRIL ) 10 MG tablet   metoprolol  succinate (TOPROL -XL)  25 MG 24 hr tablet   Other Relevant Orders   Lipid panel   Other Visit Diagnoses       Annual physical exam    -  Primary   Normal exam.  Up to date on screenings and immunizations.   Relevant Orders   CBC with Differential/Platelet   Comprehensive metabolic panel with GFR   Hemoglobin A1c   Lipid panel   TSH     Encounter for screening mammogram for breast cancer       schedule in january.   Relevant Orders   MM 3D SCREENING MAMMOGRAM BILATERAL BREAST       Return in about 6 months (around 12/21/2024) for HTN.    Leita HILARIO Adie, MD [facility] [place]

## 2024-06-21 LAB — COMPREHENSIVE METABOLIC PANEL WITH GFR
ALT: 14 IU/L (ref 0–32)
AST: 16 IU/L (ref 0–40)
Albumin: 4.5 g/dL (ref 3.9–4.9)
Alkaline Phosphatase: 69 IU/L (ref 44–121)
BUN/Creatinine Ratio: 20 (ref 9–23)
BUN: 17 mg/dL (ref 6–24)
Bilirubin Total: 0.4 mg/dL (ref 0.0–1.2)
CO2: 22 mmol/L (ref 20–29)
Calcium: 9.2 mg/dL (ref 8.7–10.2)
Chloride: 104 mmol/L (ref 96–106)
Creatinine, Ser: 0.87 mg/dL (ref 0.57–1.00)
Globulin, Total: 2.3 g/dL (ref 1.5–4.5)
Glucose: 79 mg/dL (ref 70–99)
Potassium: 4.2 mmol/L (ref 3.5–5.2)
Sodium: 138 mmol/L (ref 134–144)
Total Protein: 6.8 g/dL (ref 6.0–8.5)
eGFR: 86 mL/min/1.73 (ref >59)

## 2024-06-21 LAB — CBC WITH DIFFERENTIAL/PLATELET
Basophils Absolute: 0.1 x10E3/uL (ref 0.0–0.2)
Basos: 1 %
EOS (ABSOLUTE): 0.1 x10E3/uL (ref 0.0–0.4)
Eos: 1 %
Hematocrit: 41.6 % (ref 34.0–46.6)
Hemoglobin: 13.5 g/dL (ref 11.1–15.9)
Immature Grans (Abs): 0 x10E3/uL (ref 0.0–0.1)
Immature Granulocytes: 0 %
Lymphocytes Absolute: 1.9 x10E3/uL (ref 0.7–3.1)
Lymphs: 30 %
MCH: 30.5 pg (ref 26.6–33.0)
MCHC: 32.5 g/dL (ref 31.5–35.7)
MCV: 94 fL (ref 79–97)
Monocytes Absolute: 0.4 x10E3/uL (ref 0.1–0.9)
Monocytes: 7 %
Neutrophils Absolute: 3.9 x10E3/uL (ref 1.4–7.0)
Neutrophils: 61 %
Platelets: 260 x10E3/uL (ref 150–450)
RBC: 4.42 x10E6/uL (ref 3.77–5.28)
RDW: 12.2 % (ref 11.7–15.4)
WBC: 6.3 x10E3/uL (ref 3.4–10.8)

## 2024-06-21 LAB — HEMOGLOBIN A1C
Est. average glucose Bld gHb Est-mCnc: 94 mg/dL
Hgb A1c MFr Bld: 4.9 % (ref 4.8–5.6)

## 2024-06-21 LAB — LIPID PANEL
Chol/HDL Ratio: 3.3 ratio (ref 0.0–4.4)
Cholesterol, Total: 193 mg/dL (ref 100–199)
HDL: 59 mg/dL (ref >39)
LDL Chol Calc (NIH): 119 mg/dL — ABNORMAL HIGH (ref 0–99)
Triglycerides: 82 mg/dL (ref 0–149)
VLDL Cholesterol Cal: 15 mg/dL (ref 5–40)

## 2024-06-21 LAB — TSH: TSH: 2.47 u[IU]/mL (ref 0.450–4.500)

## 2024-06-27 ENCOUNTER — Other Ambulatory Visit: Payer: Self-pay

## 2024-07-15 DIAGNOSIS — Z23 Encounter for immunization: Secondary | ICD-10-CM | POA: Diagnosis not present

## 2024-07-28 ENCOUNTER — Telehealth: Payer: Self-pay | Admitting: Nurse Practitioner

## 2024-07-28 MED ORDER — CIPROFLOXACIN HCL 500 MG PO TABS
500.0000 mg | ORAL_TABLET | Freq: Two times a day (BID) | ORAL | 0 refills | Status: AC
Start: 2024-07-28 — End: 2024-07-31

## 2024-07-28 NOTE — Telephone Encounter (Signed)
 Patient took home UA test with positive Leuks and Nitrates with 3 days of symptoms. Will treat with Cipro  x 3 days.

## 2024-08-09 ENCOUNTER — Encounter: Payer: Self-pay | Admitting: Internal Medicine

## 2024-08-25 ENCOUNTER — Other Ambulatory Visit: Payer: Self-pay | Admitting: Internal Medicine

## 2024-08-25 DIAGNOSIS — I10 Essential (primary) hypertension: Secondary | ICD-10-CM

## 2024-08-26 NOTE — Telephone Encounter (Signed)
 Change in pharmacy Requested Prescriptions  Pending Prescriptions Disp Refills   metoprolol  succinate (TOPROL -XL) 25 MG 24 hr tablet [Pharmacy Med Name: METOPROLOL  TAB 25MG  ER] 90 tablet 1    Sig: TAKE 1 TABLET (25 MG TOTAL) BY MOUTH DAILY.     Cardiovascular:  Beta Blockers Passed - 08/26/2024  2:09 PM      Passed - Last BP in normal range    BP Readings from Last 1 Encounters:  06/20/24 110/68         Passed - Last Heart Rate in normal range    Pulse Readings from Last 1 Encounters:  06/20/24 79         Passed - Valid encounter within last 6 months    Recent Outpatient Visits           2 months ago Annual physical exam   [facility], Leita DEL, MD   3 months ago Other acute pancreatitis without infection or necrosis   [facility] at [facility], Leita DEL, MD   8 months ago Benign hypertension   [facility] at [facility], Leita DEL, MD

## 2024-09-15 ENCOUNTER — Ambulatory Visit: Admitting: Family Medicine

## 2024-09-15 VITALS — BP 135/75 | HR 87 | Ht 67.5 in | Wt 173.6 lb

## 2024-09-15 DIAGNOSIS — D134 Benign neoplasm of liver: Secondary | ICD-10-CM | POA: Diagnosis not present

## 2024-09-15 DIAGNOSIS — E782 Mixed hyperlipidemia: Secondary | ICD-10-CM

## 2024-09-15 DIAGNOSIS — I10 Essential (primary) hypertension: Secondary | ICD-10-CM

## 2024-09-15 DIAGNOSIS — Z8719 Personal history of other diseases of the digestive system: Secondary | ICD-10-CM | POA: Insufficient documentation

## 2024-09-15 DIAGNOSIS — K760 Fatty (change of) liver, not elsewhere classified: Secondary | ICD-10-CM | POA: Diagnosis not present

## 2024-09-15 DIAGNOSIS — D693 Immune thrombocytopenic purpura: Secondary | ICD-10-CM

## 2024-09-15 MED ORDER — TIRZEPATIDE-WEIGHT MANAGEMENT 7.5 MG/0.5ML ~~LOC~~ SOLN: 7.5000 mg | SUBCUTANEOUS | 11 refills | Status: AC

## 2024-09-15 NOTE — Assessment & Plan Note (Signed)
 F/b Dr Aron Decreasing in size and number on last MRI with plan to repeat in 2027

## 2024-09-15 NOTE — Assessment & Plan Note (Signed)
 Cholesterol levels have been stable since weight loss and starting Zepbound . Family history of hyperlipidemia noted.

## 2024-09-15 NOTE — Assessment & Plan Note (Signed)
 Diagnosed between ages 54 to 89, with platelet counts usually around 100. No issues since then, and platelet counts have been stable for decades. - Check platelet counts annually

## 2024-09-15 NOTE — Assessment & Plan Note (Signed)
 Improving with weight loss on GLP1

## 2024-09-15 NOTE — Assessment & Plan Note (Signed)
 Currently on lisinopril  10 mg and Metoprolol  XR for blood pressure management. Blood pressure is well-controlled and significantly lower than in the past. Also on metoprolol  for blood pressure, migraines, and tachycardia. Prefers to fill metoprolol  through [facility] instead of [facility] to avoid automatic refills. - Continue current meds

## 2024-09-15 NOTE — Progress Notes (Signed)
 New patient visit   Patient: Darlene Munoz   DOB: 1981/05/25   43 y.o. Female  MRN: [id number] Visit Date: 09/15/2024  Today's healthcare provider: Jon Eva, MD   Chief Complaint  Patient presents with   New Patient (Initial Visit)    Patient is present to establish care with no concerns to report HPV Vaccines received and completed while in [place]    Subjective    Darlene Munoz is a 43 y.o. female who presents today as a new patient to establish care.   Discussed the use of AI scribe software for clinical note transcription with the patient, who gave verbal consent to proceed.  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  She is currently stable on her medication regimen and wishes to adjust her GLP-1 medication from a 5 mg dose to a 7.5 mg dose to allow administration every three weeks for convenience and cost savings. She manages her medication supply through [employer].  She takes lisinopril  10 mg for blood pressure management and prefers to maintain this dosage. Her pulse is usually elevated during visits but was lower than expected today. She also takes metoprolol  for migraines, tachycardia, and blood pressure, and she is stable on this medication without plans to discontinue.  She experienced pancreatitis due to gallstones, unrelated to her GLP-1 medication, with no further issues since passing the gallstones. No surgical intervention was required.  Her family history includes hyperlipidemia, arthritis, and fatty liver in her mother, and early heart disease in her grandfather. Socially, she consumes alcohol socially, one to two drinks once or twice a month, and does not smoke. She is open to pregnancy but not actively trying, with regular and manageable menstrual cycles.       Past Medical History:  Diagnosis Date   Allergy     Under good control with PRN OTC medication   Hyperlipidemia    Hypertension    History reviewed. No pertinent surgical history. Family Status  Relation Name Status   Mother Darlene Munoz Alive   Father Glendia Alive   Brother Darlene Munoz Alive   MGM Darlene Munoz Alive   MGF Darlene Munoz Alive   PGM Griffin Alive   PGF Darlene Munoz Alive   Mat Uncle Darlene Munoz Alive   Pat Uncle Darlene Munoz Alive  No partnership data on file   Family History  Problem Relation Age of Onset   Arthritis Mother    Hyperlipidemia Mother    Liver disease Mother        fatty liver   Hypertension Father    Hearing loss Father    Hyperlipidemia Father    Asthma Brother    Cancer Maternal Grandmother    Alcohol abuse Maternal Grandfather    Heart disease Maternal Grandfather    Hyperlipidemia Maternal Grandfather    Diabetes Paternal Grandmother    Alcohol abuse Paternal Grandfather    Heart disease Paternal Grandfather    Hyperlipidemia Paternal Grandfather    Early death Maternal Uncle    Drug abuse Paternal Uncle    Social History   Socioeconomic History   Marital status: Married    Spouse name: Not on file   Number of children: Not on file   Years of education: Not on file   Highest education level: Professional school degree (e.g., MD, DDS, DVM, JD)  Occupational History   Not on file  Tobacco  Use   Smoking status: Never   Smokeless tobacco: Never  Vaping Use   Vaping status: Never Used  Substance and Sexual Activity   Alcohol use: Yes    Alcohol/week: 1.0 standard drink of alcohol    Types: 1 Glasses of wine per week    Comment: Social drinker, usually 1-2 glasses of wine a month   Drug use: Never   Sexual activity: Yes    Birth control/protection: Condom, Rhythm  Other Topics Concern   Not on file  Social History Narrative   Not on file   Social Drivers of Health   Financial Resource Strain: Low Risk  (09/15/2024)   Overall Financial Resource Strain (CARDIA)    Difficulty of Paying Living Expenses: Not hard at all   Food Insecurity: No Food Insecurity (09/15/2024)   Hunger Vital Sign    Worried About Running Out of Food in the Last Year: Never true    Ran Out of Food in the Last Year: Never true  Transportation Needs: No Transportation Needs (09/15/2024)   PRAPARE - Administrator, Civil Service (Medical): No    Lack of Transportation (Non-Medical): No  Physical Activity: Sufficiently Active (09/15/2024)   Exercise Vital Sign    Days of Exercise per Week: 5 days    Minutes of Exercise per Session: 30 min  Stress: Patient Declined (09/15/2024)   [employer] of Occupational Health - Occupational Stress Questionnaire    Feeling of Stress: Patient declined  Social Connections: Socially Integrated (09/15/2024)   Social Connection and Isolation Panel    Frequency of Communication with Friends and Family: More than three times a week    Frequency of Social Gatherings with Friends and Family: Patient declined    Attends Religious Services: 1 to 4 times per year    Active Member of Clubs or Organizations: Yes    Attends Engineer, Structural: More than 4 times per year    Marital Status: Married   Outpatient Medications Prior to Visit  Medication Sig   cetirizine (ZYRTEC) 10 MG tablet Take 10 mg by mouth as needed.    lisinopril  (ZESTRIL ) 10 MG tablet Take 1 tablet (10 mg total) by mouth daily.   metoprolol  succinate (TOPROL -XL) 25 MG 24 hr tablet TAKE 1 TABLET (25 MG TOTAL) BY MOUTH DAILY.   [DISCONTINUED] tirzepatide  (ZEPBOUND ) 5 MG/0.5ML Pen Inject 5 mg into the skin once a week.   No facility-administered medications prior to visit.   No Known Allergies  Review of Systems      Objective    BP 135/75 (BP Location: Left Arm, Patient Position: Sitting, Cuff Size: Normal)   Pulse 87   Ht 5' 7.5 (1.715 m)   Wt 173 lb 9.6 oz (78.7 kg)   LMP 09/02/2024   SpO2 98%   BMI 26.79 kg/m     Physical Exam Vitals reviewed.  Constitutional:      General: She is not in  acute distress.    Appearance: Normal appearance. She is well-developed. She is not diaphoretic.  HENT:     Head: Normocephalic and atraumatic.  Eyes:     General: No scleral icterus.    Conjunctiva/sclera: Conjunctivae normal.  Neck:     Thyroid : No thyromegaly.  Cardiovascular:     Rate and Rhythm: Normal rate and regular rhythm.     Heart sounds: Normal heart sounds. No murmur heard. Pulmonary:     Effort: Pulmonary effort is normal. No respiratory distress.  Breath sounds: Normal breath sounds. No wheezing, rhonchi or rales.  Musculoskeletal:     Cervical back: Neck supple.     Right lower leg: No edema.     Left lower leg: No edema.  Lymphadenopathy:     Cervical: No cervical adenopathy.  Skin:    General: Skin is warm and dry.     Findings: No rash.  Neurological:     Mental Status: She is alert and oriented to person, place, and time. Mental status is at baseline.  Psychiatric:        Mood and Affect: Mood normal.        Behavior: Behavior normal.     Depression Screen    09/15/2024    3:18 PM 06/20/2024    8:12 AM 05/06/2024   11:25 AM 12/16/2023    2:12 PM  PHQ 2/9 Scores  PHQ - 2 Score 0 0 0 0  PHQ- 9 Score  0   0      Data saved with a previous flowsheet row definition   No results found for any visits on 09/15/24.  Assessment & Plan      Problem List Items Addressed This Visit       Cardiovascular and Mediastinum   Benign hypertension - Primary (Chronic)   Currently on lisinopril  10 mg and Metoprolol  XR for blood pressure management. Blood pressure is well-controlled and significantly lower than in the past. Also on metoprolol  for blood pressure, migraines, and tachycardia. Prefers to fill metoprolol  through [facility] instead of [facility] to avoid automatic refills. - Continue current meds        Digestive   Adenoma of liver (Chronic)   F/b Dr Aron Decreasing in size and number on last MRI with plan to repeat in 2027      Hepatic steatosis    Improving with weight loss on GLP1        Hematopoietic and Hemostatic   Immune thrombocytopenia (HCC) (Chronic)   Diagnosed between ages 38 to 42, with platelet counts usually around 100. No issues since then, and platelet counts have been stable for decades. - Check platelet counts annually        Other   Hyperlipidemia, mixed (Chronic)   Cholesterol levels have been stable since weight loss and starting Zepbound . Family history of hyperlipidemia noted.      History of acute pancreatitis   Previous episode of pancreatitis was due to gallstones, not related to GLP-1 medication. Passed the stones and has had no issues since. Open to gallbladder removal if symptoms recur. - Monitor for any recurrence of gallbladder symptoms           Obesity - now with BMI 26 s/p weight loss She is in the maintenance stage of weight management after significant weight loss. Prefers a 7.5 mg dose of Zepbound  every three weeks instead of a 5 mg dose weekly, as it is more convenient and cost-effective. No intention of increasing the dose; previous attempts to reduce it were unsuccessful. - Send prescription for 7.5 mg dose to [facility] with refills  General Health Maintenance Compliant with vaccinations, including Hepatitis B, with titers available on Care Everywhere. Family history includes hyperlipidemia, arthritis, and mother's fatty liver. Does not smoke and drinks alcohol socially. Not currently using birth control but is open to pregnancy.      Return in about 9 months (around 06/15/2025) for CPE.     I personally spent a total of 52 minutes in the care of  the patient today including preparing to see the patient, getting/reviewing separately obtained history, performing a medically appropriate exam/evaluation, counseling and educating, placing orders, documenting clinical information in the EHR, independently interpreting results, and coordinating care.   Jon Eva, MD  [facility] [phone] (phone) [phone] (fax)  [facility]

## 2024-09-15 NOTE — Assessment & Plan Note (Signed)
 Previous episode of pancreatitis was due to gallstones, not related to GLP-1 medication. Passed the stones and has had no issues since. Open to gallbladder removal if symptoms recur. - Monitor for any recurrence of gallbladder symptoms

## 2024-10-27 ENCOUNTER — Other Ambulatory Visit: Payer: Self-pay | Admitting: Family Medicine

## 2024-10-27 DIAGNOSIS — Z1231 Encounter for screening mammogram for malignant neoplasm of breast: Secondary | ICD-10-CM

## 2024-11-17 ENCOUNTER — Encounter: Payer: Self-pay | Admitting: Family Medicine

## 2024-11-18 NOTE — Telephone Encounter (Signed)
 Have abstracted vaccines into pt chart.

## 2024-11-30 ENCOUNTER — Ambulatory Visit
Admission: RE | Admit: 2024-11-30 | Discharge: 2024-11-30 | Disposition: A | Discharge status: Home / Self Care | Source: Ambulatory Visit | Attending: Family Medicine | Admitting: Family Medicine

## 2024-11-30 DIAGNOSIS — Z1231 Encounter for screening mammogram for malignant neoplasm of breast: Secondary | ICD-10-CM | POA: Insufficient documentation

## 2024-12-06 ENCOUNTER — Ambulatory Visit: Payer: Self-pay | Admitting: Family Medicine

## 2024-12-14 ENCOUNTER — Other Ambulatory Visit

## 2024-12-14 ENCOUNTER — Telehealth: Payer: Self-pay | Admitting: Nurse Practitioner

## 2024-12-14 DIAGNOSIS — Z20828 Contact with and (suspected) exposure to other viral communicable diseases: Secondary | ICD-10-CM

## 2024-12-14 NOTE — Telephone Encounter (Signed)
 Patient requested MMR titer drawn due to increase in cases of measles in surrounding areas.  Order has been placed.

## 2024-12-15 ENCOUNTER — Ambulatory Visit: Payer: Self-pay | Admitting: Nurse Practitioner

## 2024-12-15 LAB — MEASLES/MUMPS/RUBELLA IMMUNITY
MUMPS ABS, IGG: 38.3 [AU]/ml
RUBEOLA AB, IGG: 189 [AU]/ml
Rubella Antibodies, IGG: 4.07 {index}

## 2024-12-19 ENCOUNTER — Encounter: Admitting: Student

## 2024-12-21 ENCOUNTER — Encounter: Admitting: Student

## 2025-06-26 ENCOUNTER — Encounter: Admitting: Family Medicine
